# Patient Record
Sex: Male | Born: 1950 | Race: White | Hispanic: No | Marital: Married | Smoking: Current every day smoker
Health system: Southern US, Community
[De-identification: ages and names within clinical notes are randomized; demographics above are authoritative.]

## PROBLEM LIST (undated history)

## (undated) DIAGNOSIS — M1990 Unspecified osteoarthritis, unspecified site: Secondary | ICD-10-CM

## (undated) DIAGNOSIS — E785 Hyperlipidemia, unspecified: Secondary | ICD-10-CM

## (undated) DIAGNOSIS — M67919 Unspecified disorder of synovium and tendon, unspecified shoulder: Secondary | ICD-10-CM

## (undated) DIAGNOSIS — T7840XA Allergy, unspecified, initial encounter: Secondary | ICD-10-CM

## (undated) HISTORY — DX: Hyperlipidemia, unspecified: E78.5

## (undated) HISTORY — DX: Allergy, unspecified, initial encounter: T78.40XA

## (undated) HISTORY — PX: VASECTOMY: SHX75

## (undated) HISTORY — PX: COLON SURGERY: SHX602

## (undated) HISTORY — DX: Unspecified disorder of synovium and tendon, unspecified shoulder: M67.919

## (undated) HISTORY — DX: Unspecified osteoarthritis, unspecified site: M19.90

## (undated) HISTORY — PX: SMALL INTESTINE SURGERY: SHX150

## (undated) HISTORY — PX: OTHER SURGICAL HISTORY: SHX169

## (undated) HISTORY — PX: HERNIA REPAIR: SHX51

---

## 1998-06-10 DIAGNOSIS — M67919 Unspecified disorder of synovium and tendon, unspecified shoulder: Secondary | ICD-10-CM

## 1998-06-10 HISTORY — DX: Unspecified disorder of synovium and tendon, unspecified shoulder: M67.919

## 2000-09-15 ENCOUNTER — Encounter: Admission: RE | Admit: 2000-09-15 | Discharge: 2000-09-15 | Payer: Self-pay | Admitting: Family Medicine

## 2000-09-15 ENCOUNTER — Encounter: Payer: Self-pay | Admitting: Family Medicine

## 2006-10-01 ENCOUNTER — Encounter: Admission: RE | Admit: 2006-10-01 | Discharge: 2006-10-01 | Payer: Self-pay | Admitting: Family Medicine

## 2007-03-29 ENCOUNTER — Inpatient Hospital Stay (HOSPITAL_COMMUNITY): Admission: EM | Admit: 2007-03-29 | Discharge: 2007-04-02 | Payer: Self-pay | Admitting: *Deleted

## 2007-07-17 ENCOUNTER — Encounter (INDEPENDENT_AMBULATORY_CARE_PROVIDER_SITE_OTHER): Payer: Self-pay | Admitting: General Surgery

## 2007-07-17 ENCOUNTER — Inpatient Hospital Stay (HOSPITAL_COMMUNITY): Admission: RE | Admit: 2007-07-17 | Discharge: 2007-07-23 | Payer: Self-pay | Admitting: General Surgery

## 2007-07-24 ENCOUNTER — Inpatient Hospital Stay (HOSPITAL_COMMUNITY): Admission: EM | Admit: 2007-07-24 | Discharge: 2007-08-02 | Payer: Self-pay | Admitting: Emergency Medicine

## 2007-07-24 ENCOUNTER — Encounter: Payer: Self-pay | Admitting: General Surgery

## 2007-07-25 ENCOUNTER — Encounter (INDEPENDENT_AMBULATORY_CARE_PROVIDER_SITE_OTHER): Payer: Self-pay | Admitting: Surgery

## 2007-08-24 ENCOUNTER — Inpatient Hospital Stay (HOSPITAL_COMMUNITY): Admission: AD | Admit: 2007-08-24 | Discharge: 2007-08-25 | Payer: Self-pay

## 2008-10-23 IMAGING — CT CT ABDOMEN W/ CM
2 of 5 series · 16 of 46 positions shown, 18 images · IV contrast (omnipaque)
Comparison: CT abdomen and pelvis 04/02/07

CLINICAL DATA: 56-year-old male with abdominal pain and prior partial colectomy for diverticulitis.
ABDOMEN CT WITH CONTRAST:
TECHNIQUE: Multidetector CT imaging of the abdomen was performed following the standard protocol during bolus administration of intravenous contrast.
Contrast:  100 cc Omnipaque 300
TECHNIQUE: Multidetector CT imaging of the pelvis was performed following the standard protocol during bolus administration of intravenous contrast.

[Series 2: abd/pelv with 5.0 b31f st · axial · 0.71mm/px · z∈[-552,-72]mm · 13 of 109 slices shown, 15 images]
[im 7/109  soft-tissue]
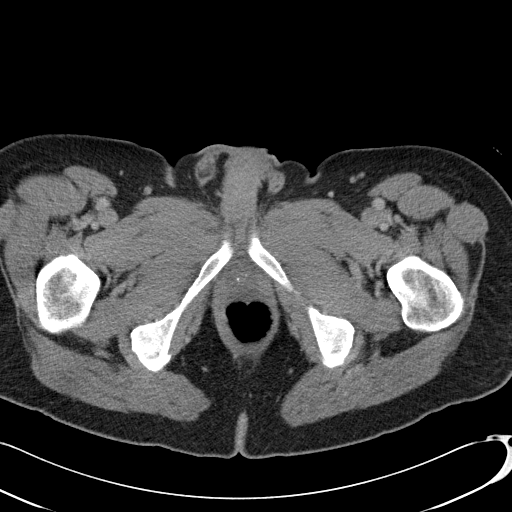
[im 7/109  bone]
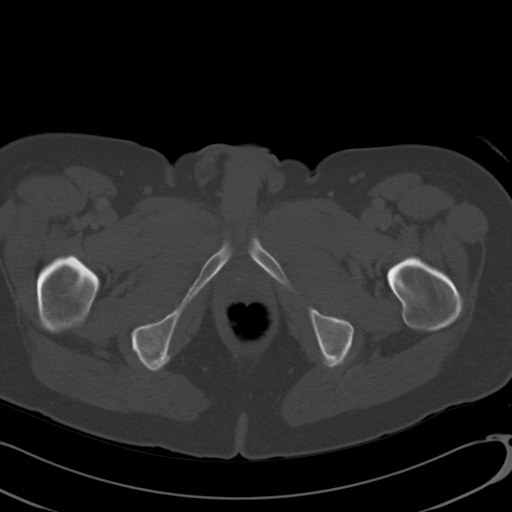
[im 13/109  soft-tissue]
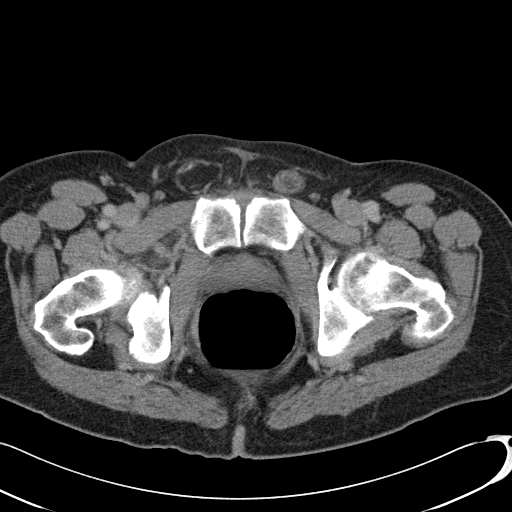
[im 25/109  soft-tissue]
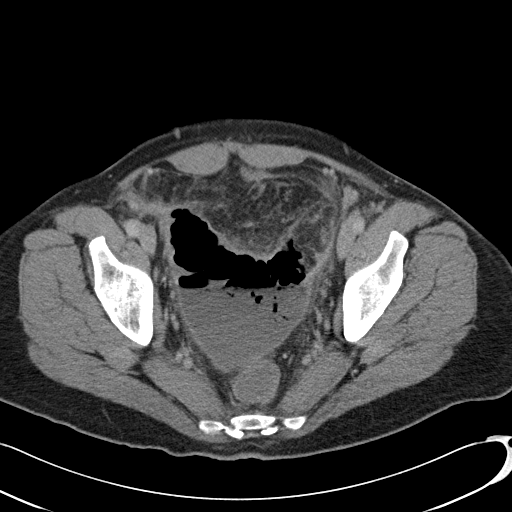
[im 31/109  soft-tissue]
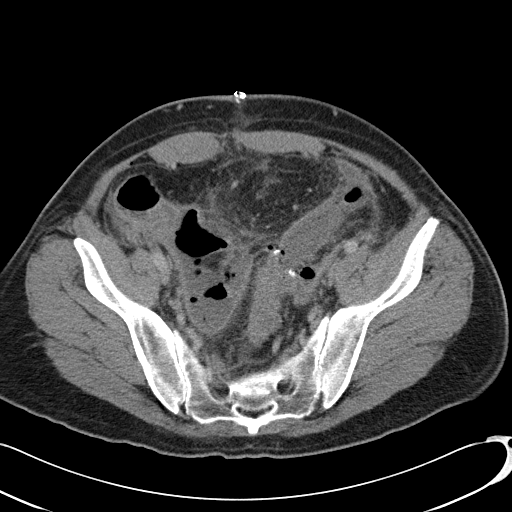
[im 37/109  soft-tissue]
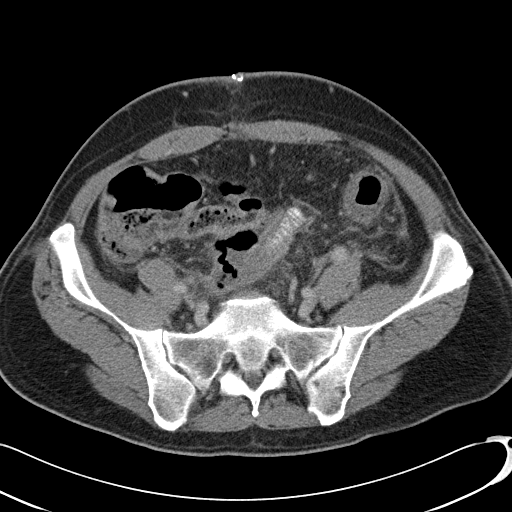
[im 49/109  soft-tissue]
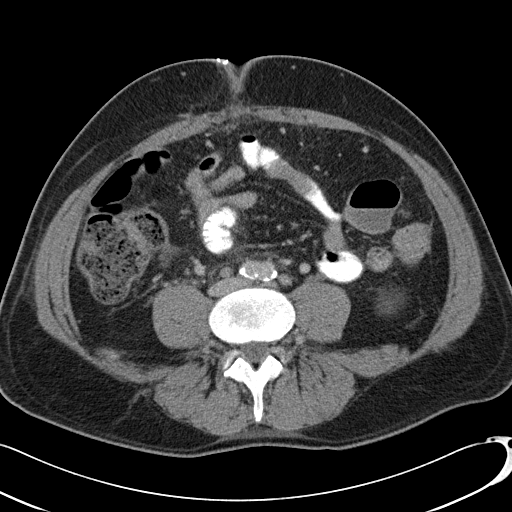
[im 55/109  soft-tissue]
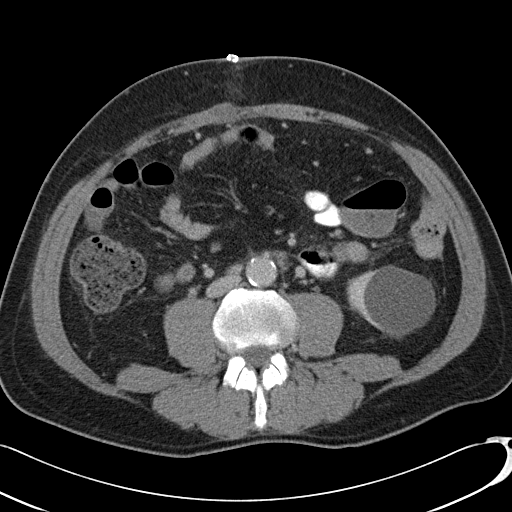
[im 61/109  soft-tissue]
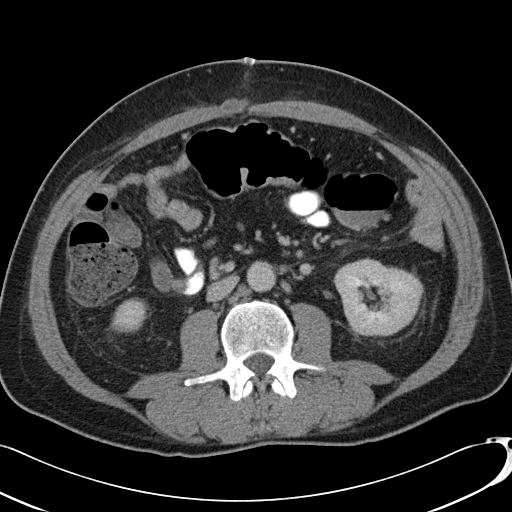
[im 73/109  soft-tissue]
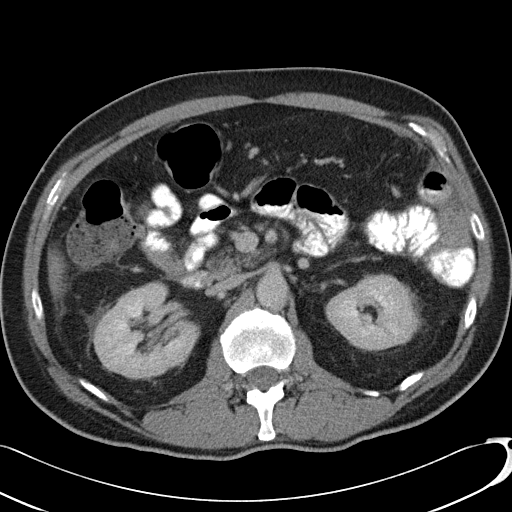
[im 73/109  bone]
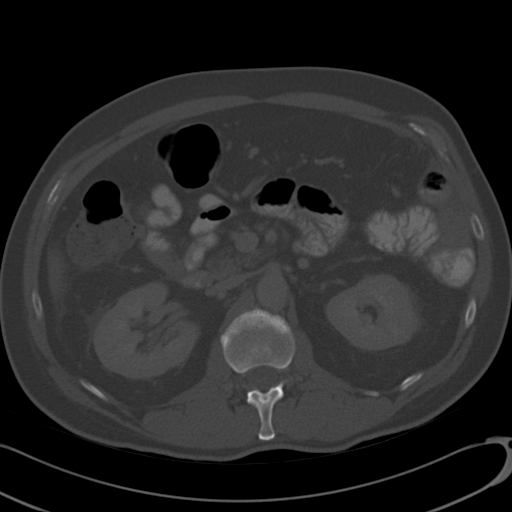
[im 79/109  soft-tissue]
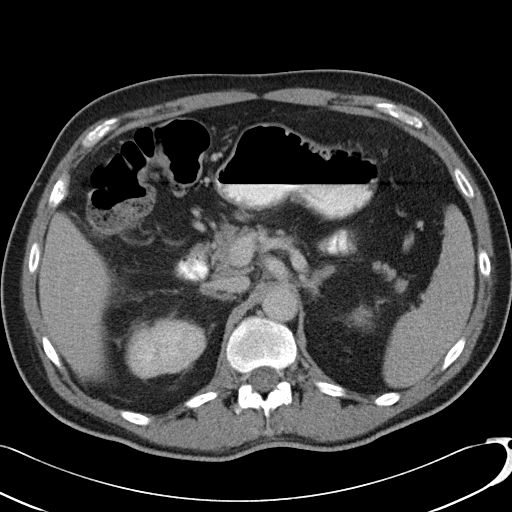
[im 85/109  soft-tissue]
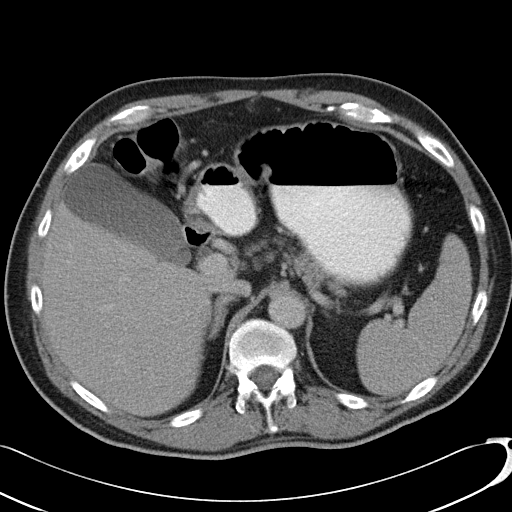
[im 97/109  soft-tissue]
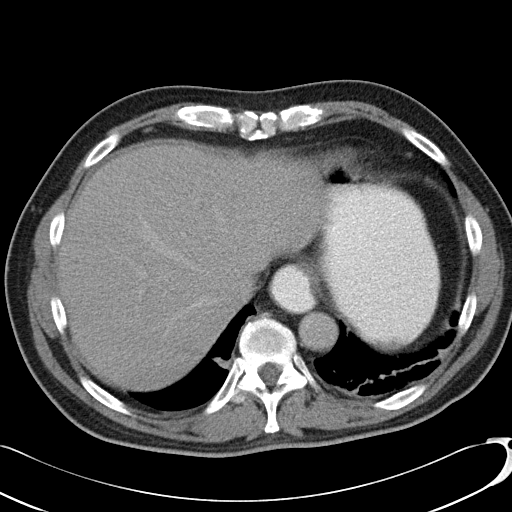
[im 103/109  soft-tissue]
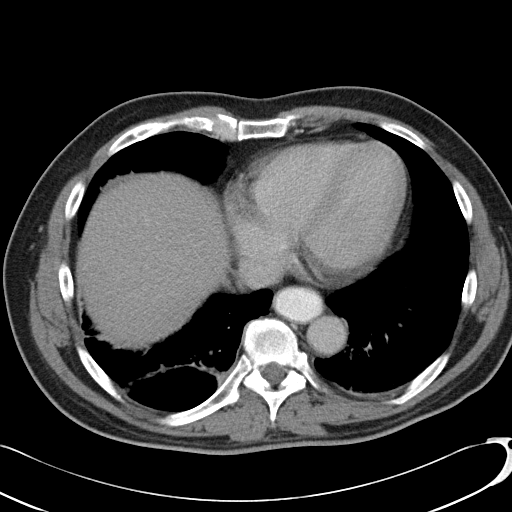

[Series 5: abd/pelv with 2.0 spo stcor · coronal · 1.06mm/px · 3 of 139 slices shown]
[im 47/139  soft-tissue]
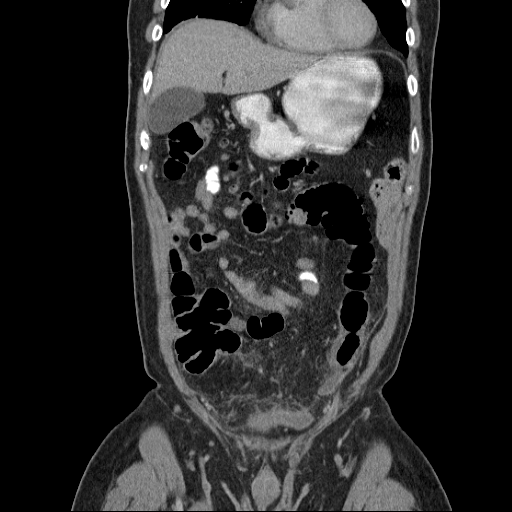
[im 62/139  soft-tissue]
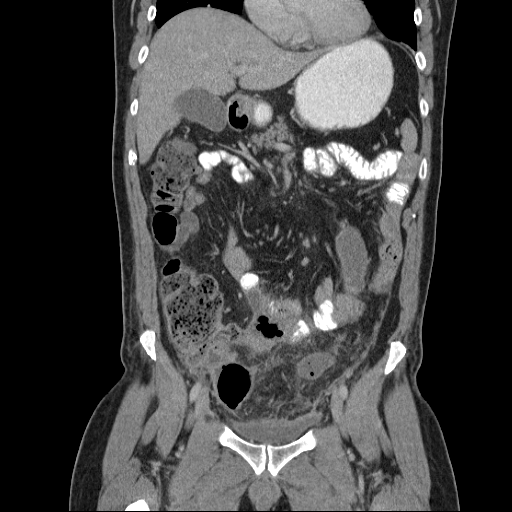
[im 77/139  soft-tissue]
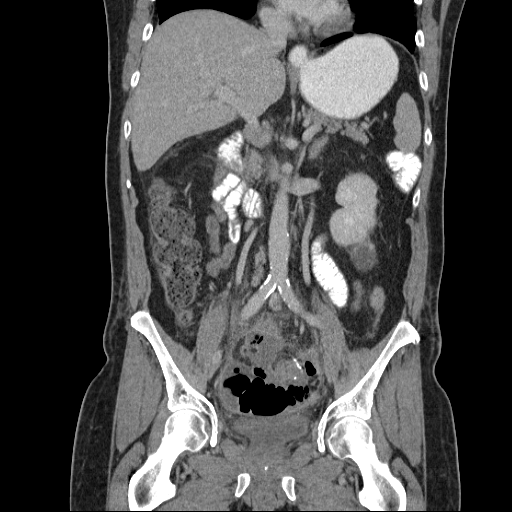

[16 of 46 positions shown; findings below may reference images not displayed]

FINDINGS: There is pleural parenchymal scarring at the bilateral lung bases increased from prior.  
There are multiple low-density lesions within the liver which are unchanged in size or number from prior, the largest of which have simple fluid attenuation and likely represent simple benign hepatic cysts.  The gallbladder, pancreas, spleen, adrenal glands, and kidneys appear normal.  (There is a low-density exophytic simple cyst from the inferior pole of the left kidney).  There is mild stranding within the mesentery of the abdomen.  There is mild stranding along ventral incisional wound.  Normal terminal ileum.  The appendix is not clearly visualized.
IMPRESSION: 1.  No acute process within the abdomen.
2.  Stable simple hepatic cysts.  
3.  Increased scarring at the lung bases.
PELVIS CT WITH CONTRAST:
FINDINGS: There is a large collection within the pelvis posterior and superior to the bladder and anterior to the rectum measuring 12 cm x 9 cm on image 85.  This collection appears to have fecal material in a dependent location and gas in a non-dependent location.  This abscess/fluid collection is adjacent to the surgical anastomosis in the sigmoid colon seen on image 79 and may represent a break down of the anastomotic line or perforation.  The administered oral contrast only advances to the distal ileum.  No evidence of extra-luminal enteric contrast and no evidence of bowel obstruction.  
Review of bone windows demonstrates no aggressive osseous lesion.
IMPRESSION: 1. Large collection of stool and gas in the pelvis adjacent to sigmoid anastamosis most consistant with perforation/leak.
Findings discussed with Dr Tram on 07/24/07.

## 2008-10-23 IMAGING — CR DG ABDOMEN ACUTE W/ 1V CHEST
4 series · 4 of 4 positions shown · non-contrast
Comparison: Abdominal CT 04/02/07.

CLINICAL DATA: Abdominal pain after surgery.
 ACUTE ABDOMINAL SERIES WITH CHEST:

[w chest pa]
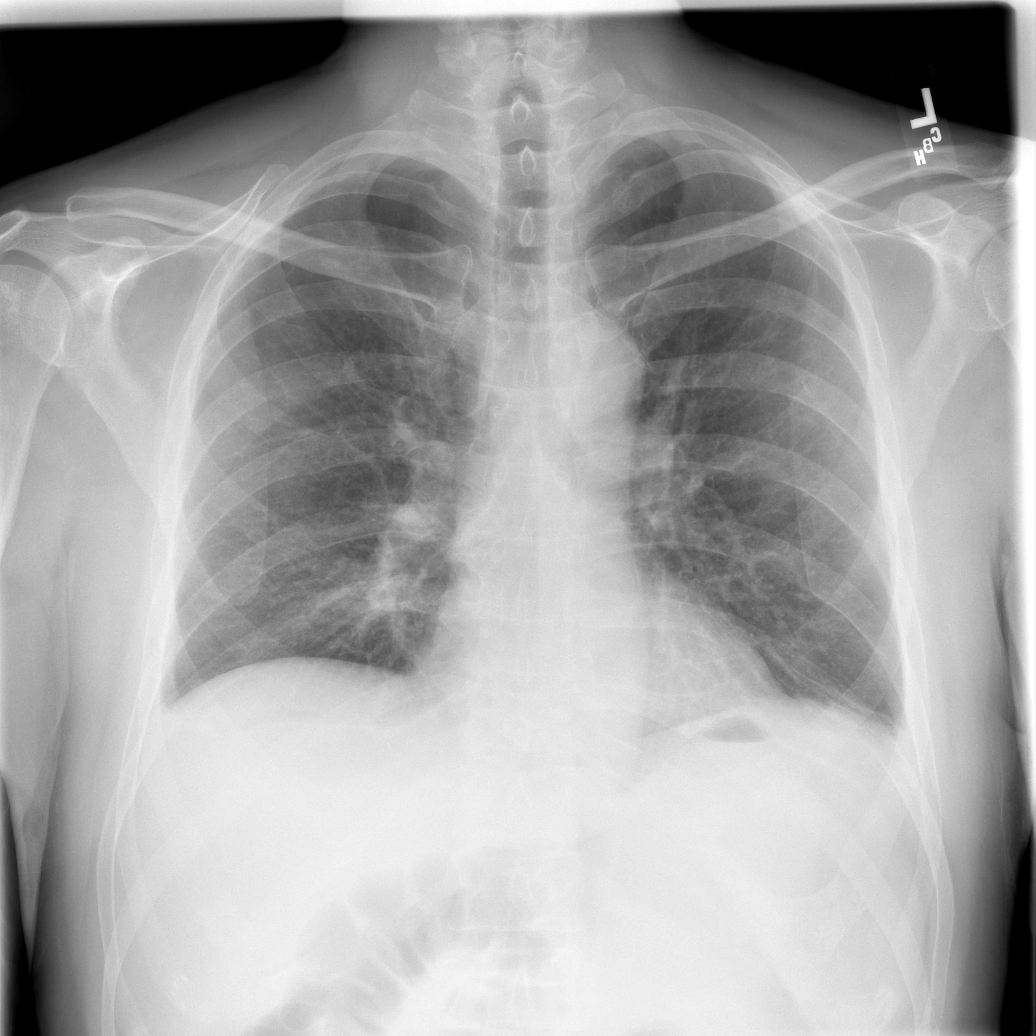

[w abdomen upright]
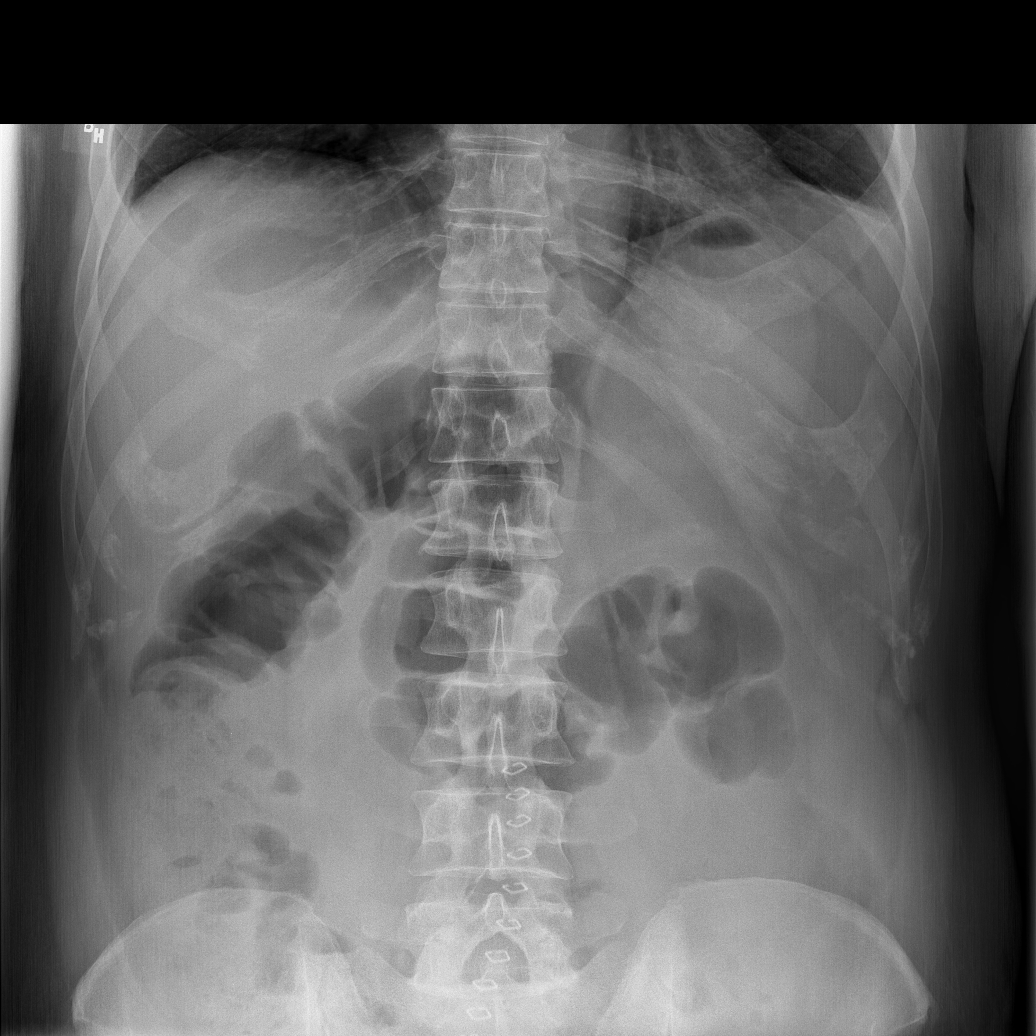

[t abdomen supine (1 of 2)]
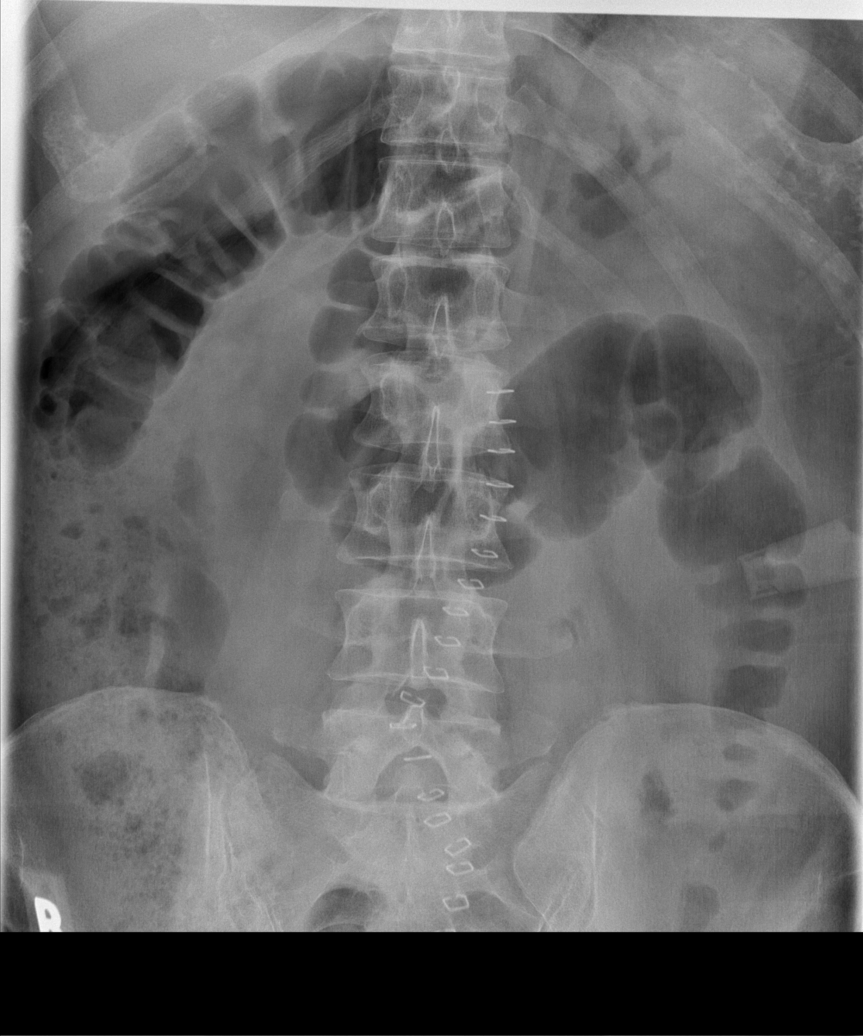

[t abdomen supine (2 of 2)]
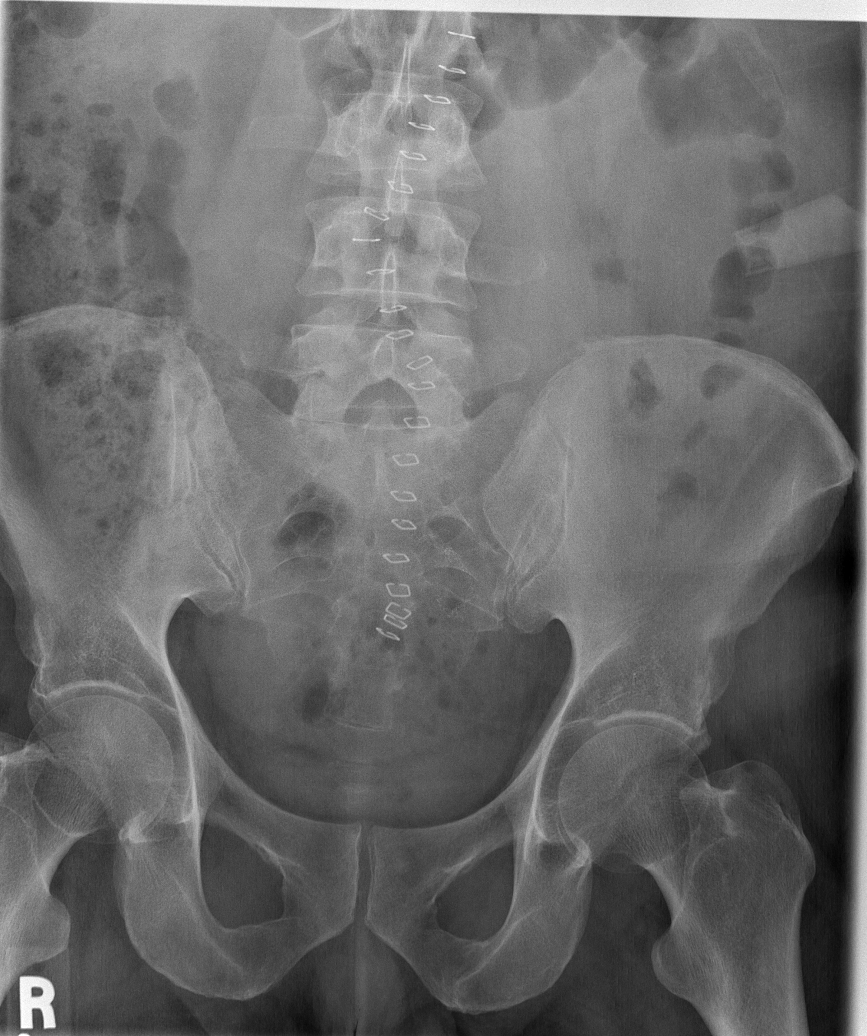

[4 of 4 positions shown; findings below may reference images not displayed]

FINDINGS: Frontal chest radiograph demonstrates mild bibasilar atelectasis.   The heart size and mediastinal contours are normal.   There is probably a small amount of pleural fluid bilaterally.   
 Supine and erect views of the abdomen demonstrate a normal nonobstructive bowel gas pattern.   Midline skin staples are noted.   There is no evidence of free intraperitoneal air.
IMPRESSION: 1.   No acute abdominal findings status post abdominal surgery.
 2.   Mild basilar atelectasis and small bilateral pleural effusions.

## 2010-07-01 ENCOUNTER — Encounter: Payer: Self-pay | Admitting: Family Medicine

## 2010-10-23 NOTE — Discharge Summary (Signed)
NAME:  Anthony [name], Anthony [name] NO.:  [id number]   MEDICAL RECORD NO.:  [id number]          PATIENT TYPE:  INP   LOCATION:  5148                         FACILITY:  [facility]   PHYSICIAN:  Gabrielle Dare. Janee Morn, M.D.DATE OF BIRTH:  1951-03-02   DATE OF ADMISSION:  07/17/2007  DATE OF DISCHARGE:  07/23/2007                               DISCHARGE SUMMARY   DISCHARGE DIAGNOSIS:  Status post sigmoid and partial left colectomy for  diverticular disease.   HISTORY OF PRESENT ILLNESS:  Mr. Schmelzle is a 60 year old gentleman who  presented for elective sigmoid colectomy for chronic diverticular  disease.   HOSPITAL COURSE:  The patient underwent a sigmoid and partial left  colectomy on July 17, 2007 on the day of admission.  Mobilization of  splenic flexure was also done, so it was a very difficult case due to a  very short colonic mesentery.  EEA anastomosis was done.  Postoperatively, he had the usual ileus.  NG tube was continued.  He was  given inhalers to assist with respiratory toilet.  His white count  trended back towards normal.  He tolerated some oral pain medication and  passed bowel movements x2 on February 10, so he was started on clear  liquids the following day.  He had his diet advanced further.  Pain was  controlled on oral pain medication.  His abdominal exam was soft.  He  had absolutely no fevers and no tachycardia or other problems.  Subsequently, on postoperative day 6, after tolerating regular diet,  continuing to move his bowels, and with a stable abdominal exam and  vitals he was discharged home.   DISCHARGE DIET:  Low residual.   DISCHARGE MEDICATIONS:  Percocet 5/325 one to two every 6 hours as  needed for pain.  He is also to take docusate over-the-counter stool  softener 100 mg daily.  He will also continue his home medications of  Lipitor 10 mg daily, multivitamin, and other supplements daily, 81 mg of  aspirin daily, glucosamine supplement daily and  Prilosec one daily.   FOLLOW-UP:  Is in approximately 5 days with myself or sooner, if he has  any trouble.      Gabrielle Dare Janee Morn, M.D.  Electronically Signed     BET/MEDQ  D:  08/13/2007  T:  08/14/2007  Job:  [id number]

## 2010-10-23 NOTE — Op Note (Signed)
NAME:  Anthony [name], SCIULLI NO.:  [id number]   MEDICAL RECORD NO.:  [id number]          PATIENT TYPE:  INP   LOCATION:  5125                         FACILITY:  [facility]   PHYSICIAN:  Ardeth Sportsman, MD     DATE OF BIRTH:  1950-12-31   DATE OF PROCEDURE:  07/25/2007  DATE OF DISCHARGE:  07/24/2007                               OPERATIVE REPORT   PRIMARY CARE PHYSICIAN:  Donia Guiles, MD   PRIOR SURGEON:  Gabrielle Dare. Janee Morn, MD   OPERATING SURGEON:  Ardeth Sportsman, MD   OPERATING ASSISTANT:  Angelia Mould. Derrell Lolling, MD.   [facility]   PREOPERATIVE DIAGNOSIS:  Postop day 7 sigmoid colectomy for  diverticulitis with pelvic abscess and question of anastomotic leak.   POSTOPERATIVE DIAGNOSES:  1. Status post sigmoid colectomy with anastomosis for recurrent      diverticulitis.  2. Pelvic hematoma/abscess.  3. Anastomotic leak.   PROCEDURE PERFORMED:  1. Exploratory laparotomy.  2. Drainage of pelvic abscess.  3. Partial colectomy of ischemic/necrotic descending colon.  4. Proximal descending colon colostomy left upper quadrant.   ANESTHESIA:  General anesthesia.   SPECIMENS:  Descending colon opened and his distal drained, 19 [place]  Blake drain rests in the pelvis and towards the left paracolic gutter.   ESTIMATED BLOOD LOSS:  Three hundred mL of fresh blood, about 500 mL of  old blood.   INDICATIONS FOR PROCEDURE:  Anthony [name] is a 60 year old gentleman who is  postop day 7 for an elective sigmoid colectomy for recurrent bouts of  diverticulitis.  He had a 25 EEA stapled anastomosis.  Postoperatively  he recovered a little bit slowly but well enough to go home.  He started  having increasing malaise, bloating and decreased appetite and started  complaining of some back pain and difficulty with urination.  Evaluation  showed evidence of a very large pelvic abscess with gas near the  anastomosis concerning for an anastomotic  leak.  Differential diagnosis  was discussed, options discussed and recommendations made for  exploratory laparotomy with drainage of pelvic abscess and possible  takedown of anastomosis if evidence of perforation or necrosis.   Risks such as stroke or heart attack, deep venous thrombosis, pulmonary  embolism and death were discussed.  Risks such as bleeding, need for  transfusion, wound infection, abscess, injury to other organs, prolonged  pain, incisional hernia, need for re-operation and other risks were  discussed.  Questions answered.  He and his wife agreed to proceed.   OPERATIVE FINDINGS:  He had a large pocket of gas and old blood down in  his pelvis.  There was no frank purulence.  He had obvious anterior  perforation involving about 1/6 of the circumference of the colon.  The  colon 5 cm proximal to the anastomosis had evidence of necrosis and  ischemia.  There was no twisting or torsion on the anastomosis.   DESCRIPTION OF PROCEDURE:  Informed consent was confirmed.  The patient  had already received IV Zosyn prior to induction.  Sequential  compression devices were active during the entire case.  He was  positioned supine with arms out.  He underwent general anesthesia  without any difficulty.  He had a Foley catheter sterilely placed.  His  abdomen was prepped and draped in sterile fashion.  Clips were removed.   Incision was opened up bluntly in the subcutaneous tissues.  Looped PDS  stitches were trimmed and moved out.  I got into the peritoneal cavity  easily.  The omentum was still in place.  Using careful dissection I was  able to enter into a very large pocket down in the pelvis that was being  walled off by the omentum, small bowel, and peritoneum with foul smell  to it.  This looked like old blood.  Large swath was aspirated and sent  for culture.  Hematoma was aspirated and suctioned.  The greater omentum  was gradually freed off at its attachment to the  peritoneum and small  bowel loops and descending colon and peeled back.  Small bowel loops  could be seen climbing posterior to the anastomosis and these were  brought back over towards the right.  There were some dense adhesions of  some small bowel loops to the retroperitoneum and down towards the  anastomosis and these were carefully freed off as well.  With completion  of this it became apparent that there was anterior perforation involving  about 1/6 of the circumference.  There was significant ischemia headed  more towards necrosis at the proximal end of the anastomosis and the  descending colon.  The rectal stump appeared viable although inflamed.  A fair amount of peel and contamination down in the pelvis.  With this I  decided to take down the anastomosis.  The rectal stump was too inflamed  to place a contour staple on it so therefore we used cautery to help  free the proximal end of the anastomosis off.  The rectal stump was  oversewn using running 2-0 PDS as well as 2-0 Prolene stitches in both  corners with long tails.   The greater omentum was freed off the proximal descending colon and up  to the mid transverse colon to help better mobilize the colon.  Some  adhesions of the left upper quadrant were freed off although it did  appear that the patient had pretty good splenic flexure mobilization.  Care was made to avoid any injury to the transverse and descending colon  mesentery.  The nonviable colon was transected using a GIA stapler and  specimen was sent off, opened and distal.  Copious irrigation was done  of about 12 liters in all quadrants, over half of it down in the pelvis  until there was good clear return.  Obvious fibrinous peel with necrotic  peritoneum was debrided off the rectal stump down in the bladder wall  and off the peritoneal covering of the dome of the bladder that came off  easily.  Hemostasis was ensured.  Because of his obesity and distention  I  elected to place the colostomy in the left upper quadrant and the site  was chosen a few fingerbreadths superior and about 2 fingerbreadths  superior and 3 fingerbreadths lateral to the umbilicus in the left upper  quadrant staying away from the subcostal ridge.  A circular defect was  created about 3 x 3 cm in size.  Swath of subcutaneous tissue was freed  out.  The anterior rectus fascia was opened in a cruciate fashion.  The  rectus muscle was split and the posterior fascia  was opened up.  Dilation was done to allow 2 fingerbreadths easily.  The descending  colon stump was brought up through the wound easily and I could bring up  about 2 inches out of the wound.   Fascia was closed using running #1 looped PDS for both ends as well as  using #1 PDS interrupted internal retention stitches x4 with good  result.  The wound was irrigated copiously with over a liter of saline  with a nice clean return.  The wound was gently approximated using some  intermittent staples using large gaps to avoid overclosure.  The staples  were more closely together around the umbilicus for cosmetic reasons.  The wound was packed using 4-0 4 x 4 gauze.  Sterile dressings covered.  The colostomy was matured using 3-0 Vicryl interrupted stitches first  placing in the 4 corners to allow the ostomy to evert and Brooke rather  well, and Brooke at least about a centimeter and a half off the skin.  Note that the mesentery seemed to align best at high noon if the patient  was standing up, in other words at the superior edge.  Colostomy  appliance was placed.  The patient was taken to the recovery room in  stable condition.   I explained the operative findings to the patient's wife.  Postop  instructions were discussed.  She expressed understanding and  appreciation.      Ardeth Sportsman, MD  Electronically Signed     SCG/MEDQ  D:  07/25/2007  T:  07/27/2007  Job:  [id number]   cc:   Donia Guiles, M.D.  Gabrielle Dare Janee Morn, M.D.

## 2010-10-23 NOTE — Consult Note (Signed)
NAME:  Anthony [name], Anthony [name] NO.:  [id number]   MEDICAL RECORD NO.:  [id number]          PATIENT TYPE:  INP   LOCATION:  5734                         FACILITY:  [facility]   PHYSICIAN:  Gabrielle Dare. Janee Morn, M.D.DATE OF BIRTH:  01-Apr-1951   DATE OF CONSULTATION:  03/30/2007  DATE OF DISCHARGE:                                 CONSULTATION   REASON FOR CONSULTATION:  Diverticulitis.   HISTORY OF PRESENT ILLNESS:  I was asked to evaluate this pleasant 60-  year-old gentleman in regards to descending colon and sigmoid  diverticulitis by Dr. Adela Glimpse.  He has a history of diverticulitis in  the past, though previous episodes have been mild.  He has never been  hospitalized for it before.  He has undergone colonoscopy at [facility] in  the past.  This time, the pain developed in his left lower quadrant and  was more severe than previous attacks.  He was admitted and placed on  intravenous antibiotics.  A CT scan of the abdomen and pelvis was done,  this demonstrates a descending colon and sigmoid diverticulitis with a  2.5 cm mural abscess.  There is no perforation or free air.  The patient  currently is feeling much better.  Took some pain medication and the  pain is much less.   PAST MEDICAL HISTORY:  1. Diverticulitis.  2. Hypercholesterolemia.   PAST SURGICAL HISTORY:  None.   MEDICATIONS:  At home:  1. Cipro.  2. Flagyl.   ALLERGIES:  NO KNOWN DRUG ALLERGIES.   SOCIAL HISTORY:  He smokes cigarettes.  He does not drink alcohol.  He  works at [employer].   REVIEW OF SYSTEMS:  Significant for GI consistent with left lower  quadrant and abdominal pain.  He has no significant nausea.  Remainder  of the review of systems is unremarkable.   PHYSICAL EXAMINATION:  VITAL SIGNS:  Temperature is 99.0, blood pressure  95/54, heart rate 82, respirations 20, saturations 96%.  GENERAL:  He is awake and alert.  HEENT:  Pupils are equal.  Sclerae are clear.  NECK:  Supple  with no tenderness.  LUNGS:  Clear to auscultation.  No wheezing is heard.  HEART:  Regular.  No murmurs are heard.  Pulses palpable in the left  chest.  ABDOMEN:  Soft.  He has mild left lower quadrant tenderness.  He has no  masses palpated.  No hepatosplenomegaly is noted.  SKIN:  Warm and dry with no rashes.  EXTREMITIES:  No significant edema.  LYMPH:  Reveals no supraclavicular, cervical, or periumbilical  lymphadenopathy.   LABORATORY DATA:  Data reviewed includes white blood cell count 11.3,  hemoglobin 15.1.  Basic metabolic panel was within normal limits.  Liver  function tests were normal.  Lipase is 14.   IMPRESSION:  Sigmoid and descending colon diverticulitis with a small  mural abscess.   RECOMMENDATIONS:  Recommend continuing intravenous antibiotics and bowel  rest.  We will follow him along with you.  I feel he will likely respond  to medical treatment; however, if he worsens he may need emergent  colectomy  and colostomy.  This was discussed with him in detail.  Questions were answered.      Gabrielle Dare Janee Morn, M.D.  Electronically Signed     BET/MEDQ  D:  03/30/2007  T:  03/30/2007  Job:  [id number]   cc:   [facility]

## 2010-10-23 NOTE — Discharge Summary (Signed)
NAME:  Anthony [name], Anthony [name] NO.:  [id number]   MEDICAL RECORD NO.:  [id number]          PATIENT TYPE:  INP   LOCATION:  5734                         FACILITY:  [facility]   PHYSICIAN:  Michelene Gardener, MD    DATE OF BIRTH:  06/17/1950   DATE OF ADMISSION:  03/29/2007  DATE OF DISCHARGE:  04/02/2007                               DISCHARGE SUMMARY   PRIMARY CARE PHYSICIAN:  Dr. Donia Guiles.   DISCHARGE DIAGNOSES:  1. Diverticulitis with mural abscess.  2. Hyperlipidemia.   DISCHARGE MEDICATIONS:  1. Augmentin 875 mg twice a day for seven days.  2. Vicodin 1-2 tabs p.o. q.4 h. as needed.  3. Over-the-counter Tylenol every eight hours as needed.  The patient      was advised not to take this with Vicodin at the same time.  4. Over-the-counter MiraLax as needed.  5. Zocor 20 mg p.o. once a day.   CONSULTATIONS:  Surgical consult by Dr. Gabrielle Dare. Janee Morn, done March 30, 2007.   PROCEDURES:  None.   FOLLOW-UP APPOINTMENTS:  1. Dr. Donia Guiles in 1-2 weeks.  2. Dr. Luisa Hart in 2-3 weeks.   RADIOLOGY STUDIES:  1. CT scan of the abdomen done on October 19 showed a small hiatal      hernia with hepatic and renal cysts and descending colon      diverticulosis.  2. CT scan of the pelvis done on October 19 showed acute sigmoid colon      diverticulitis with intramural abscess around 2.5 cm.  3. Repeat CT scan of the abdomen on October 23 showed no acute      abnormalities.  4. Repeat CT scan of the pelvis on October 23 showed improving sigmoid      diverticulitis with resolved abscess.   COURSE OF HOSPITALIZATION:  This is a 60 year old male who presented to  the hospital on October 19 complaining of abdominal pain.  CT scan of  his pelvis showed acute diverticulitis with intramural abscess.  The  patient was admitted to the hospital for further evaluation.  The  patient was started on IV antibiotics where he received IV Zosyn.  He  was kept initially n.p.o., and  then he was started on a clear liquid  diet.  The patient tolerated clear liquid diet very well and then he was  advanced to full diet without problems.  The patient was seen and  evaluated by surgery who recommended him for medical treatment for now,  and then they repeated his CAT scan on the 23rd of October.  His CAT  scan showed improving diverticulitis with abscess that resolved.  He was  recommended to continue on Augmentin to be taken for seven days and then  to take Vicodin for pain as needed.  The patient will follow with his  primary physician in 2-3 weeks to make sure his condition is improving.  Otherwise,  other medical conditions remained stable, and the patient was continued  on the same other medications taken at home.   __________  time is 40 minutes.      Nadear A  Arthor Captain, MD  Electronically Signed     NAE/MEDQ  D:  04/02/2007  T:  04/03/2007  Job:  [id number]   cc:   Donia Guiles, M.D.

## 2010-10-23 NOTE — H&P (Signed)
NAME:  Anthony [name], Anthony [name] NO.:  [id number]   MEDICAL RECORD NO.:  [id number]          PATIENT TYPE:  INP   LOCATION:  5152                         FACILITY:  [facility]   PHYSICIAN:  Anselm Pancoast. Weatherly, M.D.DATE OF BIRTH:  09/20/1950   DATE OF ADMISSION:  08/24/2007  DATE OF DISCHARGE:                              HISTORY & PHYSICAL   CHIEF COMPLAINT:  Feeling bad, drainage and tenderness in wound.   HISTORY OF PRESENT ILLNESS:  The patient is a 60 year old male who  underwent a sigmoid colectomy by Gabrielle Dare. Janee Morn, M.D. on July 17, 2007, and was discharged after approximately one week and then was  readmitted by Ardeth Sportsman, M.D. approximately a week later with an  anastomotic leak and a pelvic abscess.  Dr. Michaell Cowing reoperated on the  patient and converted him to an end-colostomy and Luz Brazen and drainage  and the patient has been seen here in the office on a couple of  occasions since his discharge and has been seen daily by the visiting  nurses.  He called today and said that he was having more pain.  He said  that he had asked for antibiotics and on checking his abdomen, he  obviously has purulent drainage that is coming up from multiple little  wick tracts where the nurses have been doing wound checks.  If colostomy  is end colostomy that looks like it is working nicely.  There is a  little bit of stool in it.  On rectal examination, he is uncomfortable,  but I cannot feel anything that obviously feels like an abscess.  He  says he has been nauseated today and has been unable to keep hardly  anything down and with these symptoms and obviously wound infection that  is hopefully all superficial, I think it would be best to readmit him,  put him on IV antibiotics, and get a CAT scan of the abdomen and pelvis  and talk to Velora Heckler, M.D. who is actually oncall tonight.  He  asked me to go ahead and dictate the history and physical as I am doing  at this  time.   CHRONIC MEDICATIONS:  1. Lipitor 10 mg a day.  2. Baby aspirin.  3. Multivitamin.  4. Percocet for pain.   He has not been on antibiotics.   He says that originally he was admitted by Dr. Janee Morn with  diverticulitis and abscess some time last year and then started having  repeat symptoms and that is when his surgery was scheduled for a sigmoid  colectomy.   PAST SURGICAL HISTORY:  Rotator cuff repair on the right.   SOCIAL HISTORY:  He smokes about 1/4 pack of cigarettes a day.   PHYSICAL EXAMINATION:  VITAL SIGNS:  He is 6 feet 1 inch, weight 189  pounds, blood pressure 105/79, pulse 100, and temperature 97.8.  GENERAL:  The patient appears fatigued and is quite thin.  LUNGS:  Normal.  HEART:  Sinus tachycardia.  He is not dyspneic.  ABDOMEN:  Kind of a flat abdomen and a big dressing over the midline  incision with the purulent little pockets that will drain pus when you  take the wicks out.  I opened two in the lower abdomen and with pressure  on the incision you could see it kind of mounds up in the other little  areas and the little bridges between the subcutaneous tissue are deep  enough I think it would be very painful to try to open up all of these  here in the office now since he is about four weeks following surgery.   I think that he needs to be readmitted, started on IV antibiotics, a CAT  scan of abdomen and pelvis performed, and if there is no deep intra-  abdominal abscess, then it is possible that these areas could be opened  at bedside with sedation, but I wonder if it would not be better to just  shorten general anesthesia and just open the wound.  If he has an  obvious intra-abdominal abscess, then definitely that will need to be  taken care of simultaneously.  The patient is sent over with his orders  and we will get the CT tonight and they will call the results to Dr.  Gerrit Friends.   IMPRESSION:  Wound infection, four weeks status post a surgery  for  anastomotic leak following a sigmoid colectomy approximately a week  earlier.           ______________________________  Anselm Pancoast. Zachery Dakins, M.D.     WJW/MEDQ  D:  08/24/2007  T:  08/25/2007  Job:  [id number]

## 2010-10-23 NOTE — Op Note (Signed)
NAME:  COADY, TRAIN NO.:  [id number]   MEDICAL RECORD NO.:  [id number]          PATIENT TYPE:  INP   LOCATION:  5148                         FACILITY:  [facility]   PHYSICIAN:  Gabrielle Dare. Janee Morn, M.D.DATE OF BIRTH:  [birth year]   DATE OF PROCEDURE:  07/17/2007  DATE OF DISCHARGE:                               OPERATIVE REPORT   PREOPERATIVE DIAGNOSIS:  Sigmoid diverticulitis.   POSTOPERATIVE DIAGNOSIS:  Sigmoid diverticulitis.   PROCEDURES:  1. Sigmoid and partial left colectomy.  2. Mobilization of splenic flexure.   SURGEON:  Gabrielle Dare. Janee Morn, M.D.   ASSISTANT:  Cherylynn Ridges, M.D.   ANESTHESIA:  General.   HISTORY OF PRESENT ILLNESS:  Mr. Dykman is a 60 year old gentleman who  was initially admitted to the hospital on October 2008 for sigmoid and  descending colon diverticulitis.  He had a small mural abscess that  time; he completed course of oral antibiotics.  He continued to have  some intermittent left lower quadrant pain throughout the end of 2008,  with some urgency to defecate.  He has been taking MiraLax and a stool  softener daily.  He presents today for elective sigmoid colectomy.  He  did undergo a bowel prep, which he tolerated well.  He has also held his  aspirin for 7 days.   PROCEDURE IN DETAIL:  Informed consent was obtained after identifying  the patient in the preoperative holding area.  He received intravenous  antibiotics.  He was brought to the operating room and placed in the  supine position.  General anesthesia was administered.  His abdomen was  prepped and draped in a sterile fashion.  A lower midline incision was  made, extending to just above the umbilicus.  Subcutaneous tissues were  dissected down, revealing the anterior fascia.  This was divided along  the linea alba, and the peritoneal cavity was entered under direct  vision without difficulty.  The fascia was opened to the length of the  incision.  The abdomen was  explored.  No liver lesions were noted.  He  had a very large, bulky omentum.  The nasogastric tube was adjusted into  the correct position in the stomach.  Further evaluation of the left and  sigmoid colon revealed an inflammatory mass down in the pelvis, stuck to  the pelvic sidewall.  From this chronic diverticular inflammation, the  left colon was then mobilized along the lateral peritoneal attachments,  coming along the line of Toldt and extending down to the sigmoid.  This  was further mobilized.  This was where the major diseased portion was;  it was quite stuck to the left pelvic sidewall.  We stayed very  laterally, and mobilizing it away from the ureter, it was brought up out  of the pelvis.  A couple of small veins were ligated with figure-of-  eight 2-0 silks to get good hemostasis.  This brought the sigmoid colon  up into the area much better.  It was easy to see the long diseased  segment, which did extend from several centimeters up from the  peritoneal reflection to the  mid to distal left colon, with most of the  inflammation in the distal third of the sigmoid.  A healthy place of the  distal third of the left colon was selected, proximal to which there was  no significant diverticular disease.  It was divided with a GIA 75  stapler, and the mesentery was taken down with the LigaSure.  We  continued to hug the bowel, staying away from the ureter; and took down  the mesentery of the sigmoid colon as well with the LigaSure, achieving  excellent hemostasis.  This continued down to below the diseased  segment, which again was approximately 4 cm proximal to the peritoneal  reflection.  Once we achieved that mesenteric division and were  satisfied with our hemostasis.  The colon was divided distally with a  contour stapler.  The specimen was marked for anatomic orientation and  sent to pathology.  Due to the low nature of the distal portion of the  anastomosis, we began  considering the need for doing an end-to-end  anastomosis with EEA stapling device.  At this time, further  mobilization of the left colon was required.  Unfortunately the colon  mesentery was quite foreshortened, and we needed significant further  length.  We continued our mobilization up towards the splenic flexure,  using Bovie cautery and LigaSure under direct vision.  The spleen was  protected throughout.  The splenic flexure was then completely mobilized  and brought down.  We did have to take a little bit more of the  mesentery up that way; this was done with a right angle clamp.  It was  divided on the bowel side with the LigaSure; and then this was tied off  with 2-0 silk sutures, with the first one being flashed.  That did  achieve excellent hemostasis and much better mobilization of the colon.  The artery of Drummond remained intact, and the colon did remain pink  and viable.  The area of mobilization in the left upper quadrant was  copiously irrigated.  Hemostasis was ensured.  At this time we checked  the length of our mobilized colon, and it did reach down into the  pelvis; however, due to the deep nature of the pelvic anastomosis, an  EEA stapler was selected.  The distal end of the left colon was then  cleaned and the appendices were cleared away.  We did remove the distal  2 cm, as it was a little bit dusky.  We placed a pursestring suture  clamp at the end.  We protected the field with laparotomy sponges prior  to dividing that end.  That end was also sent to Pathology.  The 2-0  Prolene suture was run through the pursestring clamp, and the clamp was  removed.  The edges of the colon were pink and very viable, with the  mucosa being nicely intact.  We then used our EEA sizers and only a 25  mm would fit.  The 29 was too large; we did not even try to insert it.  The 25 EEA stapler was opened.  The anvil was taken off the end and  placed inside the distal colon, and the  pursestring Prolene was tied.  The patient's abdomen was protected completely.  His legs were placed  from underneath the drape up and stirrups, maintaining our sterile  field.  His perianal area was prepped and draped in a sterile fashion.  I changed my gloves; my partner did as well and  then went below.  He  first did a rectal exam and then inserted the EEA stapler under my  guidance from above, until it reached an excellent portion in the  anterior part of the remaining rectosigmoid.  The stapler was opened and  the point came through easily.  This was attached to the anvil with a  pursestring; they snapped together nicely.  The stapler was fired and  then removed in standard fashion to complete fold down where it was  revealed.  There was a small amount of venous bleeding at the anterior  portion, and a 2-0 silk figure-of-eight suture ligature was placed --  achieving excellent hemostasis.  The pelvis was filled with saline.  My  assistant put a  proctoscope from below and insufflated the entire left  colon there, including anastomosis; and no air bubbles came out.  The  colon was decompressed and visualized inside with the rigid  sigmoidoscope; no abnormalities were noted.  Irrigation fluid was  evacuated.  The abdomen was copiously irrigated throughout.  The left  upper quadrant was rechecked and hemostasis was assured.  All quadrants  were irrigated.  Irrigation fluid returned clear.  We reinspected the  the colon as it extended down to our anastomosis, and it appeared  viable.  There was a small bleeder on the right lateral side near the  anastomosis.  This was ligated with a 2-0 silk suture ligature,  achieving excellent hemostasis.  The anastomosis remained intact and  again the colon did appear viable and did have peristalsis.  The  remainder of the irrigation fluid was evacuated.  The pelvis was re-  irrigated and evacuated; no further bleeding or other abnormality beyond  the  anastomosis was noted.  The bowel contents were returned to anatomic  position.  The omentum was brought down over the bowel and tucked down  into the pelvis.  The sponge, needle and instrument counts were correct.  The fascia was closed with 2 lengths of running #1 PDS looped suture and  tied in the middle.  Subcutaneous tissues were irrigated and hemostasis  was obtained.  The skin  was closed with staples.  Sponge, needle and instrument counts were  again correct.  Betadine ointment and sterile dressing were applied.  The patient tolerated procedure well without apparent complication.  He  was taken to the recovery room in stable condition.      Gabrielle Dare Janee Morn, M.D.  Electronically Signed     BET/MEDQ  D:  07/17/2007  T:  07/19/2007  Job:  [id number]

## 2010-10-23 NOTE — H&P (Signed)
NAME:  Anthony [name], Anthony [name] NO.:  [id number]   MEDICAL RECORD NO.:  [id number]          PATIENT TYPE:  INP   LOCATION:  5125                         FACILITY:  [facility]   PHYSICIAN:  Ardeth Sportsman, MD     DATE OF BIRTH:  [birth year]   DATE OF ADMISSION:  07/24/2007  DATE OF DISCHARGE:                              HISTORY & PHYSICAL   PRIMARY CARE PHYSICIAN:  Dr. Donia Guiles, M.D.   SURGEON:  Gabrielle Dare. Janee Morn, M.D.   DICTATING SURGEON:  Ardeth Sportsman, MD   REASON FOR ADMISSION:  Sigmoid diverticulitis status post colectomy with  pelvic abscess, possible perforation.   HISTORY OF PRESENT ILLNESS:  Anthony [name] is a pleasant 60 year old  gentleman who underwent a segmental sigmoid colonic resection for  recurrent episodes of diverticulitis.  He was initially evaluated by Dr.  Janee Morn back in October for this.  Although he has had milder attacks,  he developed more severe pain and therefore recommendation was made for  sigmoid colectomy, performed on 07/17/2007.  He was in the hospital for  a few days, but by the time of discharge he was tolerating p.o. well and  had adequate pain control.   At home, he noted he was having more early satiety, bloating and  decreasing flatus.  His appetite is decreased.  He has been having  complaints of some lower back pain, soreness and discomfort.  Based on  concerns, he was seen in clinic.  He was sent for a CT scan, and CT scan  shows a very large pelvic abscess with fluid and air near the  anastomosis suspicious for a leak.   PAST MEDICAL HISTORY:  1. Diverticulosis with recurrent attacks of diverticulitis.  2. Hypercholesterolemia.  3. Chronic constipation.   PAST SURGICAL HISTORY:  He had an open sigmoid colectomy with splenic  flexure mobilization on 07/17/2007.   ALLERGIES:  NONE.   MEDICATIONS:  He takes aspirin, Lipitor, often MiraLax preoperatively  but not recently, multivitamin and Q10.   SOCIAL HISTORY:  He  works for [employer].  He is here today with  his wife at the bedside.  He is a smoker, but no alcohol or drug use.   FAMILY HISTORY:  Noncontributory for early cardiopulmonary disease or  any colon cancer as he can recall.   REVIEW OF SYSTEMS:  Noted as per HPI.  In general, he has had no fevers,  chills or sweats, no change in his weight, but he has had increasing  fatigue.  Psych, neuro, eyes, cardiac, pulmonary, dermatologic, heme,  lymph, hepatic, renal or endocrine are otherwise negative.  GI:  As  noted above, no hematochezia, melena or hematemesis.  Other review of  systems are negative.   PHYSICAL EXAMINATION:  VITAL SIGNS:  His vital signs are noted in the ER  chart and are in the normal range.  GENERAL:  On physical exam, he is a well-developed, well-nourished male  who looks a little pale but not frankly toxic.  PSYCHIATRIC:  He is pleasant, interactive, with at least average  intelligence.  No evidence of any dementia,  delirium, psychosis, or  paranoia.  HEENT:  Eyes:  Pupils equal, round, reactive to light, extraocular  movements are intact, sclerae are not icteric.  They do not seem  particularly injected to me.  NEUROLOGIC:  Cranial nerves II-XII are intact.  Hand grip is 5/5, equal  and symmetrical.  No resting or tension tremors.  NECK:  Supple without any masses.  Trachea is midline.  HEART:  Regular rate and rhythm, no murmurs, gallops or rubs.  CHEST:  Clear to auscultation bilaterally.  No wheezes, rales or  rhonchi.  ABDOMEN:  Very distended but soft.  He has a low midline incision with  staples intact and no evidence of any infection.  He has no tenderness  in his upper abdomen, but in his lower abdomen he has mild to moderate  tenderness.  He is definitely tender in his right flank and his left  lower quadrant with some mild peritoneal signs.  BACK:  He is very tender in the lumbar region.  GENITALIA:  Normal external genitalia, no evidence of any  inguinal  hernias.  MUSCULOSKELETAL:  Full range of motion in the shoulders, elbows, wrists  as well as hips, knees, ankles.  LYMPH:  No head, neck, axillary or groin lymphadenopathy.   LABORATORY DATA:  His white count is 18.3 which is increased with an  obvious left shift.  His hemoglobin is 13.  His INR is 1.2, in the  normal range.  His electrolytes are in the normal range except for a  bilirubin of 1.3 which is slightly elevated, and AST is 39 which is  slightly elevated.  His albumin is only 3.   He had an x-ray with no acute abdomen series which shows no evidence of  pneumonia, mild basilar atelectasis and effusions, but no acute findings  in the abdomen.  He had a CT scan of the abdomen and pelvis which I  reviewed with radiology which shows a very large fluid collection down  around his pelvis with a lot of gas.  It is near his colocolonic  anastomosis down in the left lower quadrant pelvic region.  There is no  evidence of any bowel obstruction or incisional hernias.   ASSESSMENT/PLAN:  A 60 year old gentleman on postop day 7 from sigmoid  colectomy and splenic flexure mobilization with large pelvic fluid and  abscess and abdominal pain concerning for a large postoperative abscess  but with probable anastomotic leak.  Options were discussed and  recommendations were made.  1. Admit.  2. IV fluids.  3. IV Zosyn.  4. Exploratory laparotomy with drainage of pelvic abscess and possible      need for colostomy.   The risks of stroke, heart attack, deep venous thrombosis, pulmonary  embolism and death were discussed.  Risks such as bleeding, need for  transfusion, wound infection, abscess, injury to the organs, prolonged  pain, prolonged hospital stay, ileus, need for later colostomy take down  in three to six months depending on how he is doing and the other risks  were discussed.  Their questions were answered.  He and his wife agreed  to proceed.      Ardeth Sportsman,  MD  Electronically Signed     SCG/MEDQ  D:  07/24/2007  T:  07/27/2007  Job:  [id number]   cc:   Donia Guiles, M.D.

## 2010-10-23 NOTE — H&P (Signed)
NAME:  Anthony [name], Anthony [name] NO.:  [id number]   MEDICAL RECORD NO.:  [id number]          PATIENT TYPE:  INP   LOCATION:  1823                         FACILITY:  [facility]   PHYSICIAN:  Michiel Cowboy, MDDATE OF BIRTH:  05-Oct-1950   DATE OF ADMISSION:  03/29/2007  DATE OF DISCHARGE:                              HISTORY & PHYSICAL   PRIMARY CARE PHYSICIAN:  __________   CHIEF COMPLAINT:  Abdominal pain.   HISTORY OF PRESENT ILLNESS:  This is a 60 year old gentleman with  history of diverticulitis and hyperlipidemia who for the past 1 month  has been struggling with abdominal pain worsening after eating.  Physical exam and his primary care physician felt that this was most  consistent with another episode of diverticulitis.  At the end of  August, he received a course of Flagyl and Cipro.  After completion, he  was feeling somewhat better, but thereafter in the beginning of October,  his symptoms have recurred.  He presented to primary care physician  again on March 23, 2007 and was started again on Flagyl and Cipro but  continued to have a lot of abdominal pain and fevers.  Eventually the  patient presented to the emergency department at [facility].  The  patient was noted to have a white blood cell count of 12.4.  CAT scan  was done showing diverticulitis and intraluminal 2.5-cm abscess.   REVIEW OF SYSTEMS:  Significant for weight loss, occasional headaches,  fevers, and chills.  No chest pain, no shortness of breath.  No nausea,  vomiting, or diarrhea.  No claudication noted.   PAST MEDICAL HISTORY:  1. Hyperlipidemia.  2. Diverticulitis episodes.  3. Constipation.   FAMILY HISTORY:  Noncontributory.   SOCIAL HISTORY:  Positive for current tobacco abuse.  No alcohol abuse.  No drugs.  The patient works at [employer].   ALLERGIES:  NO KNOWN DRUG ALLERGIES.   MEDICATIONS:  1. Patient currently on Cipro and Flagyl.  2. Aspirin 81 mg p.o. daily.  3.  Lipitor 10 mg p.o. daily.  4. MiraLax 1 pack a day.  5. Multivitamin.  6. Q10.   PHYSICAL EXAMINATION:  VITAL SIGNS:  Temperature 98.6, pulse 77,  respirations 18, blood pressure 108/68.  GENERAL:  The patient appears to be in no acute distress, young-  appearing 60 year old.  HEENT:  Moist mucous membranes.  NECK:  No lymphadenopathy.  CHEST:  Clear to auscultation bilaterally.  HEART:  Regular rate and rhythm.  No murmurs, rubs, or gallops.  ABDOMEN:  A bit distended.  No guarding noted.  Abdomen nonacute.  EXTREMITIES:  No clubbing, cyanosis, or edema.  NEUROLOGIC:  Nonfocal.   IMAGING DATA:  CT scan of the abdomen showed diverticulitis plus 2.5-cm  intraluminal wall abscess.  No perforation.   LABORATORY DATA:  Urine with 3-6 white blood cells.  Sodium 136,  potassium 3.8, creatinine 0.8.  Total bilirubin 1.2.  White blood cell  count up to 12.4, H&H 15.8/45.1, platelets 361.   ASSESSMENT AND PLAN:  This is a 60 year old gentleman with repeated  episodes of diverticulitis admitted with another  episode of  diverticulitis and intraluminal abscess.  1. Diverticulitis.  Will make the patient nothing by mouth, hydrate,      start on Zosyn, as the patient has failed Flagyl and Cipro in the      past.  Currently the patient is stable, but we will contact surgery      on Monday to evaluate for possible colectomy, given repeated      episodes of diverticulitis and failure of oral antibiotics.  2. History of hyperlipidemia.  Will continue Lipitor.  3. Prophylaxis.  Protonix and sequential compression devices.   The patient is a full code.      Michiel Cowboy, MD  Electronically Signed     AVD/MEDQ  D:  03/29/2007  T:  03/29/2007  Job:  [id number]

## 2010-10-26 NOTE — Discharge Summary (Signed)
NAME:  Anthony [name], Anthony [name] NO.:  [id number]   MEDICAL RECORD NO.:  [id number]          PATIENT TYPE:  INP   LOCATION:  5126                         FACILITY:  [facility]   PHYSICIAN:  Gabrielle Dare. Janee Morn, M.D.DATE OF BIRTH:  02/07/1951   DATE OF ADMISSION:  07/24/2007  DATE OF DISCHARGE:  08/02/2007                               DISCHARGE SUMMARY   DISCHARGE DIAGNOSES:  1. Anastomotic leak status post sigmoid colectomy.  2. Status post exploratory laparotomy.  3. Partial colectomy and colostomy.   HISTORY OF PRESENT ILLNESS:  Mr. Anthony [name] is a 60 year old gentleman who  underwent a sigmoid colectomy for severe diverticulitis on July 17, 2007.  He had been discharged in stable condition tolerating his diet.  He went on to develop severe lower abdominal pain, came to the emergency  department for evaluation.  A CT scan demonstrated evidence of  disruption of his anastomosis, and he was taken emergently to the  operative room by my partner, Dr. Karie Soda.   HOSPITAL COURSE:  The patient underwent exploratory laparotomy with  takedown anastomosis and colostomy placement.  Postoperatively, he was  maintained on intravenous antibiotics.  He remained hemodynamically  stable.  He was afebrile.  White count which was initially 20,000,  normalized.  His ileus postoperatively gradually resolved.  He was  continued on Zosyn.  Cultures demonstrated Klebsiella, which was  sensitive to ciprofloxacin.  However, he was continued on Zosyn while he  was in hospital.  He tolerated advancement of his diet.  Colostomy  functioned well.  JP drain drainage decreased to minimal.  A followup CT  scan was done demonstrating no obstruction and no remaining abscess  within the abdomen and it also showed almost complete resolution of his  old abscess seen on admission.  He continued to do well, and was  discharged home in stable condition on postoperative day 9.   DISCHARGE DIET:  Regular.   DISCHARGE MEDICATIONS:  Ciprofloxacin 500 mg 1 p.o. b.i.d. for 7 days  for pain.  He will also continue his home medications of:  Lipitor 10 mg  daily, multivitamin supplement, aspirin 81 mg daily, glucosamine daily,  Prilosec 40 mg daily, and docusate 100 mg daily.  Follow up is within 1  week with myself at the office.      Gabrielle Dare Janee Morn, M.D.  Electronically Signed     BET/MEDQ  D:  09/03/2007  T:  09/04/2007  Job:  [id number]

## 2011-03-01 LAB — CBC
HCT: 35.1 — ABNORMAL LOW
HCT: 37.5 — ABNORMAL LOW
HCT: 37.9 — ABNORMAL LOW
HCT: 29.7 — ABNORMAL LOW
HCT: 30 — ABNORMAL LOW
Hemoglobin: 13.4
Hemoglobin: 15.5
Hemoglobin: 14.7
MCHC: 35.3
MCHC: 35.5
MCHC: 34.8
MCHC: 35.1
MCHC: 35.5
MCV: 95
MCV: 95.5
MCV: 95.9
MCV: 95.6
MCV: 95.9
MCV: 96.7
Platelets: 258
Platelets: 438 — ABNORMAL HIGH
Platelets: 375
Platelets: 423 — ABNORMAL HIGH
RBC: 3.96 — ABNORMAL LOW
RBC: 4.63
RBC: 3.09 — ABNORMAL LOW
RBC: 3.14 — ABNORMAL LOW
RBC: 4.35
RDW: 12.1
WBC: 10.3
WBC: 13.1 — ABNORMAL HIGH
WBC: 11.3 — ABNORMAL HIGH
WBC: 12.3 — ABNORMAL HIGH
WBC: 14.3 — ABNORMAL HIGH
WBC: 17.4 — ABNORMAL HIGH

## 2011-03-01 LAB — CULTURE, ROUTINE-ABSCESS

## 2011-03-01 LAB — BASIC METABOLIC PANEL
BUN: 10
BUN: 9
CO2: 26
CO2: 26
CO2: 26
Calcium: 8.3 — ABNORMAL LOW
Calcium: 8.3 — ABNORMAL LOW
Chloride: 102
Chloride: 103
Chloride: 100
Chloride: 103
Creatinine, Ser: 0.71
Creatinine, Ser: 0.74
Creatinine, Ser: 0.86
GFR calc Af Amer: >60
GFR calc non Af Amer: >60
GFR calc non Af Amer: 59 — ABNORMAL LOW
GFR calc non Af Amer: >60
Glucose, Bld: 172 — ABNORMAL HIGH
Glucose, Bld: 94
Glucose, Bld: 137 — ABNORMAL HIGH
Glucose, Bld: 177 — ABNORMAL HIGH
Potassium: 4.2
Potassium: 4.1
Potassium: 4.7
Sodium: 133 — ABNORMAL LOW
Sodium: 132 — ABNORMAL LOW
Sodium: 133 — ABNORMAL LOW
Sodium: 135

## 2011-03-01 LAB — DIFFERENTIAL
Lymphocytes Relative: 8 — ABNORMAL LOW
Lymphs Abs: 1.5
Monocytes Absolute: 1.2 — ABNORMAL HIGH
Monocytes Relative: 7
Neutro Abs: 15.4 — ABNORMAL HIGH

## 2011-03-01 LAB — COMPREHENSIVE METABOLIC PANEL
AST: 39 — ABNORMAL HIGH
Albumin: 3 — ABNORMAL LOW
Calcium: 8.7
Creatinine, Ser: 0.74

## 2011-03-01 LAB — ANAEROBIC CULTURE

## 2011-03-01 LAB — IRON AND TIBC

## 2011-03-01 LAB — HEMOGLOBIN AND HEMATOCRIT, BLOOD: HCT: 37.5 — ABNORMAL LOW

## 2011-03-04 LAB — COMPREHENSIVE METABOLIC PANEL
Albumin: 3.7
BUN: 6
Creatinine, Ser: 0.71
Total Bilirubin: 0.6
Total Protein: 7.4

## 2011-03-04 LAB — URINALYSIS, ROUTINE W REFLEX MICROSCOPIC
Bilirubin Urine: NEGATIVE
Nitrite: NEGATIVE
Specific Gravity, Urine: 1.011
Urobilinogen, UA: 0.2

## 2011-03-04 LAB — PROTIME-INR: INR: 1.1

## 2011-03-04 LAB — CBC
HCT: 37.9 — ABNORMAL LOW
MCHC: 34.1
MCV: 92
Platelets: 560 — ABNORMAL HIGH
RDW: 13.2

## 2011-03-04 LAB — APTT: aPTT: 31

## 2011-03-20 LAB — PROTIME-INR
INR: 1.2
Prothrombin Time: 15.9 — ABNORMAL HIGH

## 2011-03-20 LAB — DIFFERENTIAL
Basophils Absolute: 0
Basophils Relative: 0
Eosinophils Absolute: 0.2
Neutro Abs: 9 — ABNORMAL HIGH
Neutrophils Relative %: 73

## 2011-03-20 LAB — BASIC METABOLIC PANEL
BUN: 6
CO2: 24
Chloride: 104
Creatinine, Ser: 0.77
GFR calc Af Amer: >60
Potassium: 3.7

## 2011-03-20 LAB — TYPE AND SCREEN
ABO/RH(D): O POS
Antibody Screen: NEGATIVE

## 2011-03-20 LAB — URINALYSIS, ROUTINE W REFLEX MICROSCOPIC
Hgb urine dipstick: NEGATIVE
Ketones, ur: 40 — AB
Protein, ur: NEGATIVE
Urobilinogen, UA: 0.2

## 2011-03-20 LAB — CBC
HCT: 36.2 — ABNORMAL LOW
HCT: 39.5
HCT: 45.1
Hemoglobin: 15.8
MCHC: 35.1
MCHC: 35.3
MCHC: 35.6
MCV: 93.4
MCV: 94.9
Platelets: 342
Platelets: 364
RBC: 4.16 — ABNORMAL LOW
RBC: 4.51
RBC: 4.74
RDW: 11.8
WBC: 11.3 — ABNORMAL HIGH
WBC: 12.4 — ABNORMAL HIGH

## 2011-03-20 LAB — COMPREHENSIVE METABOLIC PANEL
ALT: 21
Albumin: 2.8 — ABNORMAL LOW
Alkaline Phosphatase: 86
BUN: 2 — ABNORMAL LOW
BUN: 7
CO2: 25
Calcium: 8.8
Chloride: 102
Creatinine, Ser: 0.81
Glucose, Bld: 114 — ABNORMAL HIGH
Glucose, Bld: 94
Potassium: 3.7
Potassium: 3.9
Total Bilirubin: 1.2
Total Protein: 6

## 2011-03-20 LAB — CULTURE, BLOOD (ROUTINE X 2)

## 2011-03-20 LAB — OCCULT BLOOD X 1 CARD TO LAB, STOOL
Fecal Occult Bld: NEGATIVE
Fecal Occult Bld: POSITIVE

## 2011-03-20 LAB — URINE MICROSCOPIC-ADD ON

## 2012-03-12 ENCOUNTER — Other Ambulatory Visit: Payer: Self-pay | Admitting: Gastroenterology

## 2012-08-08 ENCOUNTER — Ambulatory Visit (INDEPENDENT_AMBULATORY_CARE_PROVIDER_SITE_OTHER): Payer: Managed Care, Other (non HMO) | Admitting: Family Medicine

## 2012-08-08 VITALS — BP 124/79 | HR 70 | Temp 97.8°F | Resp 16 | Ht 74.0 in | Wt 231.0 lb

## 2012-08-08 DIAGNOSIS — K219 Gastro-esophageal reflux disease without esophagitis: Secondary | ICD-10-CM

## 2012-08-08 DIAGNOSIS — J322 Chronic ethmoidal sinusitis: Secondary | ICD-10-CM

## 2012-08-08 DIAGNOSIS — J329 Chronic sinusitis, unspecified: Secondary | ICD-10-CM

## 2012-08-08 DIAGNOSIS — E7800 Pure hypercholesterolemia, unspecified: Secondary | ICD-10-CM | POA: Insufficient documentation

## 2012-08-08 MED ORDER — CEFDINIR 300 MG PO CAPS: 300.0000 mg | ORAL_CAPSULE | Freq: Two times a day (BID) | ORAL | Status: DC

## 2012-08-08 NOTE — Progress Notes (Signed)
[facility] [address], [place] [address] [phone]  Date:  08/08/2012   Name:  Anthony [name]   DOB:  11/30/1950   MRN:  [id number]  PCP:  No primary provider on file.    Chief Complaint: Sinusitis   History of Present Illness:  Anthony [name] is a 62 y.o. very pleasant male patient who presents with the following:  He has noted sinus congestion for 2 or 3 weeks- he has now developed some facial pain and tooth pain over the last 2 or 3 days He does not have a ST but it is not very severe He does have a cough and chest congestion a swell He has noted possible fever and chills, but is using tylenol as needed.    He had extensive bowel surgery in 2009/ 2010- he had a temporarily colostomy bag but is now doing well. 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  However, he does not have any other current bowel problems and is able to take abx without problems generally No diarrhea    There is no problem list on file for this patient.   Past Medical History  Diagnosis Date  . Allergy   . Arthritis   . Neuromuscular disorder     Past Surgical History  Procedure Laterality Date  . Colon surgery    . Hernia repair    . Vasectomy    . Small intestine surgery      History  Substance Use Topics  . Smoking status: Current Every Day Smoker  . Smokeless tobacco: Not on file  . Alcohol Use: Yes    Family History  Problem Relation Age of Onset  . Cancer Mother     Allergies  Allergen Reactions  . Citrus     Medication list has been reviewed and updated.  No current outpatient prescriptions on file prior to visit.   No current facility-administered medications on file prior to visit.    Review of Systems:  As per HPI- otherwise negative.   Physical Examination: Filed Vitals:   08/08/12 1202  BP: 124/79  Pulse: 76  Temp: 97.8 F (36.6 C)  Resp: 16   Filed Vitals:    08/08/12 1202  Height: 6\' 2"  (1.88 m)  Weight: 231 lb (104.781 kg)   Body mass index is 29.65 kg/(m^2). Ideal Body Weight: Weight in (lb) to have BMI = 25: 194.3  GEN: WDWN, NAD, Non-toxic, A & O x 3 HEENT: Atraumatic, Normocephalic. Neck supple. No masses, No LAD.  Bilateral TM wnl, oropharynx normal.  PEERL,EOMI.   Sinus tenderness and nasal congestion/ purulent discharge Ears and Nose: No external deformity. CV: RRR, No M/G/R. No JVD. No thrill. No extra heart sounds. PULM: CTA B, no wheezes, crackles, rhonchi. No retractions. No resp. distress. No accessory muscle use. ABD: S, NT, abdomen is slightly protuberant due to his multiple surgeries and loss of muscle integrity. No rebound. No HSM. EXTR: No c/c/e NEURO Normal gait.  PSYCH: Normally interactive. Conversant. Not depressed or anxious appearing.  Calm demeanor.    Assessment and Plan: Ethmoid sinusitis - Plan: cefdinir (OMNICEF) 300 MG capsule  High cholesterol  GERD (gastroesophageal reflux disease)  Treat for sinuitis with omnicef- encouraged probiotics given his past bowel history.   See patient instructions for more details.     Abbe Amsterdam, MD

## 2012-08-08 NOTE — Patient Instructions (Addendum)
Use the antibiotic as directed, and drink plenty of fluids.  If you are not getting better in the next few days please let me know- Sooner if worse.

## 2013-05-27 ENCOUNTER — Other Ambulatory Visit: Payer: Self-pay | Admitting: Physician Assistant

## 2013-05-27 ENCOUNTER — Ambulatory Visit
Admission: RE | Admit: 2013-05-27 | Discharge: 2013-05-27 | Disposition: A | Discharge status: Home / Self Care | Payer: 59 | Source: Ambulatory Visit | Attending: Physician Assistant | Admitting: Physician Assistant

## 2013-05-27 DIAGNOSIS — R52 Pain, unspecified: Secondary | ICD-10-CM

## 2013-07-09 ENCOUNTER — Other Ambulatory Visit: Payer: Self-pay | Admitting: Family Medicine

## 2013-07-09 ENCOUNTER — Ambulatory Visit
Admission: RE | Admit: 2013-07-09 | Discharge: 2013-07-09 | Disposition: A | Discharge status: Home / Self Care | Payer: 59 | Source: Ambulatory Visit | Attending: Family Medicine | Admitting: Family Medicine

## 2013-07-09 DIAGNOSIS — R9389 Abnormal findings on diagnostic imaging of other specified body structures: Secondary | ICD-10-CM

## 2015-03-08 ENCOUNTER — Other Ambulatory Visit: Payer: Self-pay | Admitting: Family Medicine

## 2015-03-08 ENCOUNTER — Ambulatory Visit
Admission: RE | Admit: 2015-03-08 | Discharge: 2015-03-08 | Disposition: A | Discharge status: Home / Self Care | Payer: 59 | Source: Ambulatory Visit | Attending: Family Medicine | Admitting: Family Medicine

## 2015-03-08 DIAGNOSIS — R062 Wheezing: Secondary | ICD-10-CM

## 2015-03-08 DIAGNOSIS — R0789 Other chest pain: Secondary | ICD-10-CM

## 2017-01-29 ENCOUNTER — Other Ambulatory Visit: Payer: Self-pay | Admitting: Family Medicine

## 2017-01-29 DIAGNOSIS — R1012 Left upper quadrant pain: Secondary | ICD-10-CM

## 2017-01-29 DIAGNOSIS — R109 Unspecified abdominal pain: Secondary | ICD-10-CM

## 2017-02-05 ENCOUNTER — Ambulatory Visit
Admission: RE | Admit: 2017-02-05 | Discharge: 2017-02-05 | Disposition: A | Discharge status: Home / Self Care | Payer: 59 | Source: Ambulatory Visit | Attending: Family Medicine | Admitting: Family Medicine

## 2017-02-05 DIAGNOSIS — R1012 Left upper quadrant pain: Secondary | ICD-10-CM

## 2017-02-05 DIAGNOSIS — R109 Unspecified abdominal pain: Secondary | ICD-10-CM

## 2018-01-21 ENCOUNTER — Other Ambulatory Visit: Payer: Self-pay | Admitting: Family Medicine

## 2018-01-21 DIAGNOSIS — K7689 Other specified diseases of liver: Secondary | ICD-10-CM

## 2018-01-31 ENCOUNTER — Ambulatory Visit
Admission: RE | Admit: 2018-01-31 | Discharge: 2018-01-31 | Disposition: A | Discharge status: Home / Self Care | Payer: 59 | Source: Ambulatory Visit | Attending: Family Medicine | Admitting: Family Medicine

## 2018-01-31 DIAGNOSIS — K7689 Other specified diseases of liver: Secondary | ICD-10-CM

## 2018-01-31 MED ORDER — GADOBENATE DIMEGLUMINE 529 MG/ML IV SOLN
20.0000 mL | Freq: Once | INTRAVENOUS | Status: AC | PRN
  Administered 2018-01-31: 20 mL via INTRAVENOUS

## 2019-04-21 ENCOUNTER — Other Ambulatory Visit: Payer: Self-pay | Admitting: Family Medicine

## 2019-06-30 ENCOUNTER — Ambulatory Visit: Payer: 59 | Attending: Internal Medicine

## 2019-06-30 DIAGNOSIS — Z23 Encounter for immunization: Secondary | ICD-10-CM | POA: Insufficient documentation

## 2019-06-30 NOTE — Progress Notes (Signed)
   Covid-19 Vaccination Clinic  Name:  Anthony [name]    MRN: [id number] DOB: 19-Jul-1950  06/30/2019  Anthony [name] was observed post Covid-19 immunization for 15 minutes without incidence. He was provided with Vaccine Information Sheet and instruction to access the V-Safe system.   Anthony [name] was instructed to call 911 with any severe reactions post vaccine: Marland Kitchen Difficulty breathing  . Swelling of your face and throat  . A fast heartbeat  . A bad rash all over your body  . Dizziness and weakness    Immunizations Administered    Name Date Dose VIS Date Route   Pfizer COVID-19 Vaccine 06/30/2019  5:11 PM 0.3 mL 05/21/2019 Intramuscular   Manufacturer: [employer], [employer]   Lot: [id number]   NDC: [address]-1

## 2019-07-18 ENCOUNTER — Ambulatory Visit: Payer: 59 | Attending: Internal Medicine

## 2019-07-18 DIAGNOSIS — Z23 Encounter for immunization: Secondary | ICD-10-CM | POA: Insufficient documentation

## 2019-07-18 NOTE — Progress Notes (Signed)
   Covid-19 Vaccination Clinic  Name:  Anthony [name]    MRN: [id number] DOB: 1950/07/28  07/18/2019  Mr. Anthony [name] was observed post Covid-19 immunization for 30 minutes based on pre-vaccination screening without incidence. He was provided with Vaccine Information Sheet and instruction to access the V-Safe system.   Mr. Anthony [name] was instructed to call 911 with any severe reactions post vaccine: Marland Kitchen Difficulty breathing  . Swelling of your face and throat  . A fast heartbeat  . A bad rash all over your body  . Dizziness and weakness    Immunizations Administered    Name Date Dose VIS Date Route   Pfizer COVID-19 Vaccine 07/18/2019 12:30 PM 0.3 mL 05/21/2019 Intramuscular   Manufacturer: [employer], [employer]   Lot: [id number]   NDC: [address]-2

## 2020-10-26 ENCOUNTER — Other Ambulatory Visit: Payer: Self-pay

## 2020-10-26 DIAGNOSIS — N281 Cyst of kidney, acquired: Secondary | ICD-10-CM

## 2020-10-27 ENCOUNTER — Ambulatory Visit
Admission: RE | Admit: 2020-10-27 | Discharge: 2020-10-27 | Disposition: A | Discharge status: Home / Self Care | Payer: 59 | Source: Ambulatory Visit | Attending: Family Medicine | Admitting: Family Medicine

## 2020-10-27 DIAGNOSIS — N281 Cyst of kidney, acquired: Secondary | ICD-10-CM

## 2021-04-26 ENCOUNTER — Other Ambulatory Visit: Payer: Self-pay | Admitting: Family Medicine

## 2021-04-26 DIAGNOSIS — N281 Cyst of kidney, acquired: Secondary | ICD-10-CM

## 2021-05-10 ENCOUNTER — Ambulatory Visit
Admission: RE | Admit: 2021-05-10 | Discharge: 2021-05-10 | Disposition: A | Discharge status: Home / Self Care | Payer: Medicare Other | Source: Ambulatory Visit | Attending: Family Medicine | Admitting: Family Medicine

## 2021-05-10 DIAGNOSIS — N281 Cyst of kidney, acquired: Secondary | ICD-10-CM

## 2021-07-16 ENCOUNTER — Other Ambulatory Visit: Payer: Self-pay

## 2021-07-16 ENCOUNTER — Ambulatory Visit
Admission: RE | Admit: 2021-07-16 | Discharge: 2021-07-16 | Disposition: A | Discharge status: Home / Self Care | Payer: Medicare Other | Source: Ambulatory Visit | Attending: Family Medicine | Admitting: Family Medicine

## 2021-07-16 ENCOUNTER — Other Ambulatory Visit: Payer: Self-pay | Admitting: Family Medicine

## 2021-07-16 DIAGNOSIS — R059 Cough, unspecified: Secondary | ICD-10-CM

## 2021-10-07 ENCOUNTER — Emergency Department (HOSPITAL_BASED_OUTPATIENT_CLINIC_OR_DEPARTMENT_OTHER)
Admission: EM | Admit: 2021-10-07 | Discharge: 2021-10-07 | Disposition: A | Discharge status: Home / Self Care | Payer: Medicare Other | Attending: Emergency Medicine | Admitting: Emergency Medicine

## 2021-10-07 ENCOUNTER — Other Ambulatory Visit: Payer: Self-pay

## 2021-10-07 ENCOUNTER — Emergency Department (HOSPITAL_BASED_OUTPATIENT_CLINIC_OR_DEPARTMENT_OTHER): Payer: Medicare Other

## 2021-10-07 ENCOUNTER — Encounter (HOSPITAL_BASED_OUTPATIENT_CLINIC_OR_DEPARTMENT_OTHER): Payer: Self-pay | Admitting: Emergency Medicine

## 2021-10-07 DIAGNOSIS — M25562 Pain in left knee: Secondary | ICD-10-CM | POA: Diagnosis present

## 2021-10-07 MED ORDER — ACETAMINOPHEN 325 MG PO TABS: 650.0000 mg | ORAL_TABLET | Freq: Once | ORAL | Status: DC

## 2021-10-07 NOTE — ED Triage Notes (Signed)
/  Patient states that he was putting cabinet up yesterday and he stuck his leg out and he felt something pop.Mild swelling. ?

## 2021-10-07 NOTE — Discharge Instructions (Signed)
Please read and follow all provided instructions. ? ?Your diagnoses today include:  ?1. Acute pain of left knee   ? ? ?Tests performed today include: ?An x-ray of the affected area - does NOT show any broken bones ?Vital signs. See below for your results today.  ? ?Medications prescribed:  ?None ? ?Take any prescribed medications only as directed. ? ?Home care instructions:  ?Follow any educational materials contained in this packet ?Follow R.I.C.E. Protocol: ?R - rest your injury ? I  - use ice on injury without applying directly to skin ?C - compress injury with bandage or splint ?E - elevate the injury as much as possible ? ?Follow-up instructions: ?Please follow-up with your personal orthopedist or the sports medicine referral given to you in the next 1 week for recheck. ? ?Return instructions:  ?Please return if your toes or feet are numb or tingling, appear gray or blue, or you have severe pain (also elevate the leg and loosen splint or wrap if you were given one) ?Please return to the Emergency Department if you experience worsening symptoms.  ?Please return if you have any other emergent concerns. ? ?Additional Information: ? ?Your vital signs today were: ?BP (!) 154/93   Pulse 74   Temp 98.2 ?F (36.8 ?C) (Oral)   Resp 16   Ht 6\' 5"  (1.956 m)   Wt 102.1 kg   SpO2 96%   BMI 26.68 kg/m?  ?If your blood pressure (BP) was elevated above 135/85 this visit, please have this repeated by your doctor within one month. ? ? ?

## 2021-10-07 NOTE — ED Provider Notes (Signed)
[facility] EMERGENCY DEPARTMENT Provider Note   CSN: [id number] Arrival date & time: 10/07/21  4098     History  Chief Complaint  Patient presents with   Knee Pain    Anthony [name] is a 71 y.o. male.  Patient presents to the emergency department today for evaluation of lateral left knee pain.  Symptoms started acutely yesterday when he was working on cabinetry in his house.  He states that he was squatting down and put his left leg out to the side.  When he pushed off he "felt a pop".  He has had pain at that time.  He is ambulatory.  Minimal swelling reported.  No treatments prior to arrival except for application of a knee compression sleeve.  No weakness, numbness, or tingling distally.      Home Medications Prior to Admission medications   Medication Sig Start Date End Date Taking? Authorizing Provider  atorvastatin (LIPITOR) 10 MG tablet Take 10 mg by mouth daily.   Yes [provider]  omeprazole (PRILOSEC) 10 MG capsule Take 10 mg by mouth daily.   Yes [provider]  cefdinir (OMNICEF) 300 MG capsule Take 1 capsule (300 mg total) by mouth 2 (two) times daily. 08/08/12   Copland, Gwenlyn Found, MD      Allergies    Citrus and Hydromorphone    Review of Systems   Review of Systems  Physical Exam Updated Vital Signs BP (!) 154/93   Pulse 74   Temp 98.2 F (36.8 C) (Oral)   Resp 16   Ht 6\' 5"  (1.956 m)   Wt 102.1 kg   SpO2 96%   BMI 26.68 kg/m  Physical Exam Vitals and nursing note reviewed.  Constitutional:      Appearance: He is well-developed.  HENT:     Head: Normocephalic and atraumatic.  Eyes:     Conjunctiva/sclera: Conjunctivae normal.  Cardiovascular:     Pulses: Normal pulses. No decreased pulses.  Musculoskeletal:        General: Tenderness present.     Cervical back: Normal range of motion and neck supple.     Left knee: Normal range of motion. Tenderness present over the lateral joint line. No medial joint line  tenderness. LCL laxity (suspect mild) present.     Right lower leg: No edema.     Left lower leg: No edema.  Skin:    General: Skin is warm and dry.  Neurological:     Mental Status: He is alert.     Sensory: No sensory deficit.     Comments: Motor, sensation, and vascular distal to the injury is fully intact.   Psychiatric:        Mood and Affect: Mood normal.    ED Results / Procedures / Treatments   Labs (all labs ordered are listed, but only abnormal results are displayed) Labs Reviewed - No data to display  EKG None  Radiology No results found.  Procedures Procedures    Medications Ordered in ED Medications - No data to display  ED Course/ Medical Decision Making/ A&P    Patient seen and examined. History obtained directly from patient.   Labs/EKG: None ordered  Imaging: X-ray of the left knee  Medications/Fluids: None ordered  Most recent vital signs reviewed and are as follows: BP (!) 154/93   Pulse 74   Temp 98.2 F (36.8 C) (Oral)   Resp 16   Ht 6\' 5"  (1.956 m)   Wt 102.1 kg  SpO2 96%   BMI 26.68 kg/m   Initial impression: Lateral knee pain  10:17 AM Reassessment performed. Patient appears stable.  Imaging personally visualized and interpreted including: X-ray, agree no fracture  Reviewed pertinent lab work and imaging with patient at bedside. Questions answered.   Most current vital signs reviewed and are as follows: BP (!) 154/93   Pulse 74   Temp 98.2 F (36.8 C) (Oral)   Resp 16   Ht 6\' 5"  (1.956 m)   Wt 102.1 kg   SpO2 96%   BMI 26.68 kg/m   Plan: Discharge to home.   Prescriptions written for: None  Other home care instructions discussed: RICE protocol, Tylenol (patient unable to take NSAIDs)  ED return instructions discussed: Worsening or other concerns  Follow-up instructions discussed: Encouraged to follow-up with their PCP or sports medicine referral.                              Medical Decision  Making Amount and/or Complexity of Data Reviewed Radiology: ordered.   Patient with lateral knee pain, most suspicious of lateral ligamentous injury, however meniscal injury not ruled out.  Patient is ambulatory.  Feel that he can be weightbearing as tolerated at this time.  Strongly encouraged orthopedic follow-up.  No signs of neurovascular compromise or compartment syndrome.         Final Clinical Impression(s) / ED Diagnoses Final diagnoses:  Acute pain of left knee    Rx / DC Orders ED Discharge Orders     None         , PA-C 10/07/21 1019    10/09/21, MD 10/26/21 2157

## 2022-01-22 ENCOUNTER — Other Ambulatory Visit: Payer: Self-pay | Admitting: Family Medicine

## 2022-01-22 DIAGNOSIS — I77811 Abdominal aortic ectasia: Secondary | ICD-10-CM

## 2022-01-24 ENCOUNTER — Other Ambulatory Visit: Payer: Medicare Other

## 2022-01-28 ENCOUNTER — Ambulatory Visit
Admission: RE | Admit: 2022-01-28 | Discharge: 2022-01-28 | Disposition: A | Discharge status: Home / Self Care | Payer: Medicare Other | Source: Ambulatory Visit | Attending: Family Medicine | Admitting: Family Medicine

## 2022-01-28 DIAGNOSIS — I77811 Abdominal aortic ectasia: Secondary | ICD-10-CM

## 2022-06-11 ENCOUNTER — Other Ambulatory Visit: Payer: Self-pay | Admitting: Physician Assistant

## 2022-06-11 DIAGNOSIS — Z122 Encounter for screening for malignant neoplasm of respiratory organs: Secondary | ICD-10-CM

## 2022-07-15 ENCOUNTER — Other Ambulatory Visit: Payer: Medicare Other

## 2022-09-30 ENCOUNTER — Other Ambulatory Visit: Payer: Self-pay | Admitting: Sports Medicine

## 2022-09-30 ENCOUNTER — Ambulatory Visit
Admission: RE | Admit: 2022-09-30 | Discharge: 2022-09-30 | Disposition: A | Discharge status: Home / Self Care | Payer: Medicare Other | Source: Ambulatory Visit | Attending: Sports Medicine | Admitting: Sports Medicine

## 2022-09-30 DIAGNOSIS — R0789 Other chest pain: Secondary | ICD-10-CM

## 2023-04-03 NOTE — Progress Notes (Unsigned)
Cardiology Office Note:    Date:  04/15/2023   ID:  Anthony [name], DOB 1950-12-03, MRN [id number]  PCP:  Anthony Raider, MD   [facility] Providers Cardiologist:  None     Referring MD: Anthony Raider, MD   Chief Complaint  Patient presents with   Coronary Artery Disease   Chest Pain    History of Present Illness:    Anthony [name] is a 72 y.o. male seen at the request of Dr Anthony [name] for evaluation of new incomplete RBBB and CAD. He had CT chest for cancer screening in January showing coronary calcification in the LAD and aortic atherosclerosis. He does report that he gets SOB with exertion and over the past several months has noted right anterior chest pain with exertion such as playing golf. No history of DM, HTN or family history of CAD.   Past Medical History:  Diagnosis Date   Allergy    Arthritis    Hyperlipidemia    Rotator cuff disorder 06/10/1998   surgery     Past Surgical History:  Procedure Laterality Date   COLON SURGERY     copd     HERNIA REPAIR     SMALL INTESTINE SURGERY     VASECTOMY      Current Medications: Current Meds  Medication Sig   atorvastatin (LIPITOR) 20 MG tablet Take 1 tablet (20 mg total) by mouth daily.   omeprazole (PRILOSEC) 10 MG capsule Take 10 mg by mouth daily.   [DISCONTINUED] atorvastatin (LIPITOR) 10 MG tablet Take 10 mg by mouth daily.   [DISCONTINUED] cefdinir (OMNICEF) 300 MG capsule Take 1 capsule (300 mg total) by mouth 2 (two) times daily.     Allergies:   Citrus and Hydromorphone   Social History   Socioeconomic History   Marital status: Married    Spouse name: Not on file   Number of children: 2   Years of education: Not on file   Highest education level: Not on file  Occupational History   Not on file  Tobacco Use   Smoking status: Every Day   Smokeless tobacco: Not on file  Substance and Sexual Activity   Alcohol use: Yes   Drug use: Not on file   Sexual activity: Not on file  Other  Topics Concern   Not on file  Social History Narrative   Retired [employer] and [employer].    Social Determinants of Health   Financial Resource Strain: Not on file  Food Insecurity: Not on file  Transportation Needs: Not on file  Physical Activity: Not on file  Stress: Not on file  Social Connections: Unknown (02/17/2023)   Received from [facility]   Social Network    Social Network: Not on file     Family History: The patient's family history includes Breast cancer in his mother; Cancer in his mother.  ROS:   Please see the history of present illness.     All other systems reviewed and are negative.  EKGs/Labs/Other Studies Reviewed:    The following studies were reviewed today: EKG Interpretation Date/Time:  Tuesday April 15 2023 14:36:16 EST Ventricular Rate:  61 PR Interval:  164 QRS Duration:  108 QT Interval:  420 QTC Calculation: 422 R Axis:   -19  Text Interpretation: Normal sinus rhythm Incomplete right bundle branch block When compared with ECG of 15-Jul-2007 14:47, No significant change was found Confirmed by [name], Anthony [name] [phone]) on 04/15/2023 2:53:48 PM   EKG Interpretation Date/Time:  Tuesday April 15 2023 14:36:16 EST Ventricular Rate:  61 PR Interval:  164 QRS Duration:  108 QT Interval:  420 QTC Calculation: 422 R Axis:   -19  Text Interpretation: Normal sinus rhythm Incomplete right bundle branch block When compared with ECG of 15-Jul-2007 14:47, No significant change was found Confirmed by [name], Anthony [name] [phone]) on 04/15/2023 2:53:48 PM    Recent Labs: No results found for requested labs within last 365 days.  Recent Lipid Panel No results found for: "CHOL", "TRIG", "HDL", "CHOLHDL", "VLDL", "LDLCALC", "LDLDIRECT" Dated 02/14/23: cholesterol 182, triglycerides 169, HDL 36, LDL 116. A1c 5.7%. CMET normal  Risk Assessment/Calculations:                Physical Exam:    VS:  BP 130/70 (BP Location: Left Arm, Patient Position: Sitting, Cuff  Size: Normal)   Pulse 61   Ht 6\' 1"  (1.854 m)   Wt 214 lb (97.1 kg)   SpO2 96%   BMI 28.23 kg/m     Wt Readings from Last 3 Encounters:  04/15/23 214 lb (97.1 kg)  10/07/21 225 lb (102.1 kg)  08/08/12 231 lb (104.8 kg)     GEN:  Well nourished, well developed in no acute distress HEENT: Normal NECK: No JVD; No carotid bruits LYMPHATICS: No lymphadenopathy CARDIAC: RRR, no murmurs, rubs, gallops RESPIRATORY:  Clear to auscultation without rales, wheezing or rhonchi  ABDOMEN: Soft, non-tender, non-distended MUSCULOSKELETAL:  No edema; No deformity  SKIN: Warm and dry NEUROLOGIC:  Alert and oriented x 3 PSYCHIATRIC:  Normal affect   ASSESSMENT:    1. Chest pain, unspecified type   2. High cholesterol   3. Chest pain of uncertain etiology   4. Coronary artery calcification   5. Tobacco abuse    PLAN:    In order of problems listed above:  Exertional chest tightness and DOE. Unclear if this is related to his tobacco abuse/COPD versus angina. Recommend coronary CTA to assess. Check BMET.  Tobacco abuse. Encourage complete cessation HLD. With coronary calcification recommend target LDL < 70. Will increase lipitor to 20 mg daily. Follow up labs with Dr Anthony [name] in 3 months Incomplete RBBB. No conduction delay. This is benign and a normal variant.            Medication Adjustments/Labs and Tests Ordered: Current medicines are reviewed at length with the patient today.  Concerns regarding medicines are outlined above.  Orders Placed This Encounter  Procedures   EKG 12-Lead   Meds ordered this encounter  Medications   atorvastatin (LIPITOR) 20 MG tablet    Sig: Take 1 tablet (20 mg total) by mouth daily.    Dispense:  90 tablet    Refill:  3    There are no Patient Instructions on file for this visit.   Signed, Anthony Terra [place], MD  04/15/2023 3:13 PM    [facility]

## 2023-04-15 ENCOUNTER — Ambulatory Visit: Payer: Medicare Other | Attending: Cardiology | Admitting: Cardiology

## 2023-04-15 ENCOUNTER — Encounter: Payer: Self-pay | Admitting: Cardiology

## 2023-04-15 VITALS — BP 130/70 | HR 61 | Ht 73.0 in | Wt 214.0 lb

## 2023-04-15 DIAGNOSIS — R079 Chest pain, unspecified: Secondary | ICD-10-CM | POA: Diagnosis not present

## 2023-04-15 DIAGNOSIS — E7800 Pure hypercholesterolemia, unspecified: Secondary | ICD-10-CM | POA: Diagnosis not present

## 2023-04-15 DIAGNOSIS — I2510 Atherosclerotic heart disease of native coronary artery without angina pectoris: Secondary | ICD-10-CM | POA: Diagnosis not present

## 2023-04-15 DIAGNOSIS — Z720 Tobacco use: Secondary | ICD-10-CM

## 2023-04-15 MED ORDER — METOPROLOL TARTRATE 50 MG PO TABS
50.0000 mg | ORAL_TABLET | Freq: Two times a day (BID) | ORAL | 0 refills | Status: DC
Start: 2023-04-15 — End: 2024-04-16

## 2023-04-15 MED ORDER — ATORVASTATIN CALCIUM 20 MG PO TABS: 20.0000 mg | ORAL_TABLET | Freq: Every day | ORAL | 3 refills | Status: DC

## 2023-04-15 NOTE — Patient Instructions (Signed)
Medication Instructions:  Metoprolol tartrate 50 mg 2 hours before CT *If you need a refill on your cardiac medications before your next appointment, please call your pharmacy*   Lab Work: BMET If you have labs (blood work) drawn today and your tests are completely normal, you will receive your results only by: MyChart Message (if you have MyChart) OR A paper copy in the mail If you have any lab test that is abnormal or we need to change your treatment, we will call you to review the results.   Testing/Procedures: Coronary CTA    Your cardiac CT will be scheduled at one of the below locations:   [facility] [address] [place], [place] [address] [phone]    If scheduled at [facility], please arrive at the [facility] and Children's Entrance (Entrance C2) of [facility] 30 minutes prior to test start time. You can use the FREE valet parking offered at entrance C (encouraged to control the heart rate for the test)  Proceed to the [facility] Radiology Department (first floor) to check-in and test prep.  All radiology patients and guests should use entrance C2 at [facility], accessed from [facility], even though the hospital's physical address listed is [address]    If scheduled at [facility] or [facility], please arrive 15 mins early for check-in and test prep.  There is spacious parking and easy access to the radiology department from the [facility] Heart and Vascular entrance. Please enter here and check-in with the desk attendant.   Please follow these instructions carefully (unless otherwise directed):  An IV will be required for this test and Nitroglycerin will be given.  Hold all erectile dysfunction medications at least 3 days (72 hrs) prior to test. (Ie viagra, cialis, sildenafil, tadalafil, etc)   On the Night Before the Test: Be sure to Drink plenty of  water. Do not consume any caffeinated/decaffeinated beverages or chocolate 12 hours prior to your test. Do not take any antihistamines 12 hours prior to your test. If the patient has contrast allergy:   On the Day of the Test: Drink plenty of water until 1 hour prior to the test. Do not eat any food 1 hour prior to test. You may take your regular medications prior to the test.  Take metoprolol tartrate 50 mg  two hours prior to test.    After the Test: Drink plenty of water. After receiving IV contrast, you may experience a mild flushed feeling. This is normal. On occasion, you may experience a mild rash up to 24 hours after the test. This is not dangerous. If this occurs, you can take Benadryl 25 mg and increase your fluid intake. If you experience trouble breathing, this can be serious. If it is severe call 911 IMMEDIATELY. If it is mild, please call our office.   We will call to schedule your test 2-4 weeks out understanding that some insurance companies will need an authorization prior to the service being performed.   For more information and frequently asked questions, please visit our website : [email or web address]  For non-scheduling related questions, please contact the cardiac imaging nurse navigator should you have any questions/concerns: Cardiac Imaging Nurse Navigators Direct Office Dial: [phone]   For scheduling needs, including cancellations and rescheduling, please call [place], [phone].    Follow-Up: At [facility], you and your health needs are our priority.  As part of our continuing  mission to provide you with exceptional heart care, we have created designated Provider Care Teams.  These Care Teams include your primary Cardiologist (physician) and Advanced Practice Providers (APPs -  Physician Assistants and Nurse Practitioners) who all work together to provide you with the care you need, when you need it.  We recommend signing  up for the patient portal called "MyChart".  Sign up information is provided on this After Visit Summary.  MyChart is used to connect with patients for Virtual Visits (Telemedicine).  Patients are able to view lab/test results, encounter notes, upcoming appointments, etc.  Non-urgent messages can be sent to your provider as well.   To learn more about what you can do with MyChart, go to [email or web address].    Your next appointment:   Follow up after CT   Provider:   Dr.Jordan

## 2023-04-16 ENCOUNTER — Encounter: Payer: Self-pay | Admitting: Cardiology

## 2023-04-16 LAB — BASIC METABOLIC PANEL
BUN/Creatinine Ratio: 15 (ref 10–24)
BUN: 12 mg/dL (ref 8–27)
CO2: 25 mmol/L (ref 20–29)
Calcium: 10.1 mg/dL (ref 8.6–10.2)
Chloride: 100 mmol/L (ref 96–106)
Creatinine, Ser: 0.79 mg/dL (ref 0.76–1.27)
Glucose: 98 mg/dL (ref 70–99)
Potassium: 5.2 mmol/L (ref 3.5–5.2)
Sodium: 141 mmol/L (ref 134–144)
eGFR: 96 mL/min/{1.73_m2} (ref >59)

## 2023-04-16 NOTE — Telephone Encounter (Signed)
Please advise if patient is taking Anthony [name] or Atorvastatin.20 [name]. Last note stated  Will increase lipitor to 20 [name] daily. Follow up labs with Dr Clelia Croft in 3 months   Patient stated he has been taking Anthony [name]

## 2023-04-17 ENCOUNTER — Other Ambulatory Visit: Payer: Self-pay

## 2023-04-17 DIAGNOSIS — E7800 Pure hypercholesterolemia, unspecified: Secondary | ICD-10-CM

## 2023-04-17 DIAGNOSIS — I2510 Atherosclerotic heart disease of native coronary artery without angina pectoris: Secondary | ICD-10-CM

## 2023-04-17 MED ORDER — ATORVASTATIN CALCIUM 40 MG PO TABS: 40.0000 mg | ORAL_TABLET | Freq: Every day | ORAL | 3 refills | Status: DC

## 2023-04-17 NOTE — Telephone Encounter (Signed)
Spoke to patient Dr.Jordan advised to stop Pravastatin and start Atorvastatin 40 mg daily.Repeat fasting lipid and hepatic panels in 3 months.Lab orders mailed.

## 2023-04-30 ENCOUNTER — Telehealth (HOSPITAL_COMMUNITY): Payer: Self-pay | Admitting: *Deleted

## 2023-04-30 NOTE — Telephone Encounter (Signed)
Reaching out to patient to offer assistance regarding upcoming cardiac imaging study; pt verbalizes understanding of appt date/time, parking situation and where to check in, pre-test NPO status and medications ordered, and verified current allergies; name and call back number provided for further questions should they arise  Larey Brick RN Navigator Cardiac Imaging [facility] [phone] office [phone] cell  Patient to take 50mg  metoprolol tartrate two hours prior to his cardiac CT scan if his HR is greater than 65 bpm. He is aware to arrive at 8:30 AM.

## 2023-05-01 ENCOUNTER — Ambulatory Visit (HOSPITAL_COMMUNITY)
Admission: RE | Admit: 2023-05-01 | Discharge: 2023-05-01 | Disposition: A | Discharge status: Home / Self Care | Payer: Medicare Other | Source: Ambulatory Visit | Attending: Cardiology | Admitting: Cardiology

## 2023-05-01 DIAGNOSIS — R072 Precordial pain: Secondary | ICD-10-CM

## 2023-05-01 DIAGNOSIS — I2510 Atherosclerotic heart disease of native coronary artery without angina pectoris: Secondary | ICD-10-CM | POA: Diagnosis not present

## 2023-05-01 DIAGNOSIS — R079 Chest pain, unspecified: Secondary | ICD-10-CM | POA: Insufficient documentation

## 2023-05-01 MED ORDER — NITROGLYCERIN 0.4 MG SL SUBL
0.8000 mg | SUBLINGUAL_TABLET | Freq: Once | SUBLINGUAL | Status: AC
  Administered 2023-05-01: 0.8 mg via SUBLINGUAL

## 2023-05-01 MED ORDER — NITROGLYCERIN 0.4 MG SL SUBL
SUBLINGUAL_TABLET | SUBLINGUAL | Status: AC
  Filled 2023-05-01: qty 2

## 2023-05-01 MED ORDER — IOHEXOL 350 MG/ML SOLN
100.0000 mL | Freq: Once | INTRAVENOUS | Status: AC | PRN
  Administered 2023-05-01: 100 mL via INTRAVENOUS

## 2023-07-15 LAB — HEPATIC FUNCTION PANEL
ALT: 22 [IU]/L (ref 0–44)
AST: 17 [IU]/L (ref 0–40)
Albumin: 4.3 g/dL (ref 3.9–4.9)
Alkaline Phosphatase: 105 [IU]/L (ref 44–121)
Bilirubin Total: 0.5 mg/dL (ref 0.0–1.2)
Bilirubin, Direct: 0.18 mg/dL (ref 0.00–0.40)
Total Protein: 6.8 g/dL (ref 6.0–8.5)

## 2023-07-15 LAB — LIPID PANEL
Chol/HDL Ratio: 3.6 {ratio} (ref 0.0–5.0)
Cholesterol, Total: 135 mg/dL (ref 100–199)
HDL: 38 mg/dL — ABNORMAL LOW (ref >39)
LDL Chol Calc (NIH): 77 mg/dL (ref 0–99)
Triglycerides: 105 mg/dL (ref 0–149)
VLDL Cholesterol Cal: 20 mg/dL (ref 5–40)

## 2023-08-01 ENCOUNTER — Other Ambulatory Visit: Payer: Self-pay | Admitting: Family Medicine

## 2023-08-01 DIAGNOSIS — E041 Nontoxic single thyroid nodule: Secondary | ICD-10-CM

## 2023-08-07 ENCOUNTER — Other Ambulatory Visit (HOSPITAL_COMMUNITY)
Admission: RE | Admit: 2023-08-07 | Discharge: 2023-08-07 | Disposition: A | Discharge status: Home / Self Care | Payer: Medicare Other | Source: Ambulatory Visit | Attending: Interventional Radiology | Admitting: Interventional Radiology

## 2023-08-07 ENCOUNTER — Ambulatory Visit
Admission: RE | Admit: 2023-08-07 | Discharge: 2023-08-07 | Disposition: A | Discharge status: Home / Self Care | Payer: Medicare Other | Source: Ambulatory Visit | Attending: Family Medicine | Admitting: Family Medicine

## 2023-08-07 DIAGNOSIS — E041 Nontoxic single thyroid nodule: Secondary | ICD-10-CM | POA: Insufficient documentation

## 2023-08-08 LAB — CYTOLOGY - NON PAP

## 2023-08-19 LAB — LAB REPORT - SCANNED
A1c: 6.1
EGFR: 96

## 2024-02-16 LAB — LAB REPORT - SCANNED
A1c: 5.8
Albumin, Urine POC: <0.7
Creatinine, POC: 71 mg/dL
EGFR: 97
Microalb Creat Ratio: <9.8
TSH: 0.6 (ref 0.41–5.90)

## 2024-03-26 ENCOUNTER — Other Ambulatory Visit: Payer: Self-pay | Admitting: Cardiology

## 2024-04-14 NOTE — Progress Notes (Unsigned)
 Cardiology Office Note:    Date:  04/16/2024   ID:  Anthony [name], DOB 1950/10/16, MRN [id number]  PCP:  Loreli Kins, MD   [facility] Providers Cardiologist:  None     Referring MD: Loreli Kins, MD   No chief complaint on file.   History of Present Illness:    Anthony [name] is a 73 y.o. male seen at the request of Dr Loreli for evaluation of new incomplete RBBB and CAD. He had CT chest for cancer screening in January showing coronary calcification in the LAD and aortic atherosclerosis. He does report that he gets SOB with exertion and over the past several months has noted right anterior chest pain with exertion such as playing golf. No history of DM, HTN or family history of CAD.   He had a coronary CTA last year showing nonobstructive CAD. Risk factor modification recommended.    He was on pravastatin. This was switched to lipitor 40 mg and resulted in drop in LDL from 116 to 75. He is still smoking 1 pk/week. He is active walking daily, playing golf, goes to gym 3 days a week. Is trying to eat healthier. No chest pain, dizziness, palpitations.   Past Medical History:  Diagnosis Date   Allergy    Arthritis    Hyperlipidemia    Rotator cuff disorder 06/10/1998   surgery     Past Surgical History:  Procedure Laterality Date   COLON SURGERY     copd     HERNIA REPAIR     SMALL INTESTINE SURGERY     VASECTOMY      Current Medications: Current Meds  Medication Sig   aspirin EC 81 MG tablet Take 81 mg by mouth daily. Swallow whole.   atorvastatin  (LIPITOR) 40 MG tablet TAKE 1 TABLET BY MOUTH EVERY DAY   co-enzyme Q-10 30 MG capsule Take 100 mg by mouth daily.   cyanocobalamin (VITAMIN B12) 500 MCG tablet Take 500 mcg by mouth daily.   loratadine (CLARITIN) 10 MG tablet Take 10 mg by mouth daily.   magnesium oxide (MAG-OX) 400 (240 Mg) MG tablet Take 400 mg by mouth daily.   Multiple Vitamins-Minerals (MENS MULTIVITAMIN PLUS PO) Take by mouth.    omeprazole (PRILOSEC) 10 MG capsule Take 10 mg by mouth daily.   Plant Sterols and Stanols (CHOLESTOFF) 450 MG TABS Take 450 mg by mouth 3 (three) times a week.   psyllium (REGULOID) 0.52 g capsule Take 0.52 g by mouth daily.     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    Social Drivers of Corporate Investment Banker Strain: Not on file  Food Insecurity: Not on file  Transportation Needs: Not on file  Physical Activity: Not on file  Stress: Not on file  Social Connections: Unknown (02/17/2023)   Received from [facility]   Social  Network    Social Network: Not on file     Family History: The patient's family history includes Breast cancer in his mother; Cancer in his mother.  ROS:   Please see the history of present illness.     All other systems reviewed and are negative.  EKGs/Labs/Other Studies Reviewed:    The following studies were reviewed today: EKG Interpretation Date/Time:  Friday April 16 2024 08:36:18 EST Ventricular Rate:  67 PR Interval:  176 QRS Duration:  104 QT Interval:  410 QTC Calculation: 433 R Axis:   -14  Text Interpretation: Normal sinus rhythm Incomplete right bundle branch block When compared with ECG of 15-Apr-2023 14:36, No significant change was found Confirmed by Madissen Wyse [phone]) on 04/16/2024 8:40:42 AM  Coronary CTA 05/01/23: Cardiac/Coronary  CTA   TECHNIQUE: The patient was scanned on a [employer].    FINDINGS: A 100 kV prospective scan was triggered in the descending thoracic aorta at 111 HU's. Axial non-contrast 3 mm slices were carried out through the heart. The data set was analyzed on a dedicated work station and scored using the Agatson method. Gantry rotation speed was 250 msecs and collimation was .6 mm. No beta blockade and 0.8 mg of sl NTG was given. The 3D data set was reconstructed in 5% intervals of the 67-82 % of the R-R cycle. Diastolic phases were analyzed on a dedicated work station using MPR, MIP and VRT modes. The patient received 80 cc of contrast.   Image quality: Fair with misregistration artifact.   Aorta: Mild dilatation of the aortic root (40.1 mm). No calcifications. No dissection.   Aortic Valve:  Trileaflet.  No calcifications.   Coronary Arteries:  Normal coronary origin.  Right dominance.   RCA is a large dominant artery that gives rise to PDA and PLA. There is a minimal (<24%) focal calcified plaque in the proximal RCA.   Left main is a large artery that gives rise to LAD and LCX arteries. Minimal (<24%) focal calcified plaque at the ostium of the left main artery.   LAD is a large vessel. Minimal focal calcified plaques in the proximal LAD. The mid LAD with minimal mixed plaque. Mid to distal LAD with a minimal focal soft plaque.   LCX is a non-dominant artery that gives rise to one large OM1 branch. There is no plaque.   Coronary Calcium  Score:   Left main: 130   Left anterior descending artery: 155   Left circumflex artery: 0   Right coronary artery: 2.44   Total: 287   Percentile: 63   Other findings:   Normal pulmonary vein drainage into the left atrium.   Normal left atrial appendage without a thrombus.   Normal size of the pulmonary artery.   IMPRESSION: 1. Coronary calcium  score of 287. This was 50 percentile for age and sex matched control.   2. Normal coronary origin with right dominance.   3. CAD-RADS 1.  Minimal non-obstructive CAD (0-24%). Consider non-atherosclerotic causes of chest pain. Consider preventive therapy and risk factor modification.   4. Total plaque volume 292 mm3 which is 30th percentile for age- and sex-matched controls (calcified plaque 61 mm3; non-calcified plaque 231 mm3; Low attenuation 10 mm3). EKG Interpretation Date/Time:  Friday April 16 2024 08:36:18 EST Ventricular Rate:  67 PR Interval:  176 QRS Duration:  104 QT Interval:  410 QTC Calculation: 433 R Axis:   -14  Text Interpretation: Normal sinus rhythm Incomplete right bundle branch block When  compared with ECG of 15-Apr-2023 14:36, No significant change was found Confirmed by Yoltzin Ransom [id number]) on 04/16/2024 8:40:42 AM    Recent Labs: 07/14/2023: ALT 22 02/16/2024: TSH 0.60  Recent Lipid Panel    Component Value Date/Time   CHOL 135 07/14/2023 0926   TRIG 105 07/14/2023 0926   HDL 38 (L) 07/14/2023 0926   CHOLHDL 3.6 07/14/2023 0926   LDLCALC 77 07/14/2023 0926   Dated 02/14/23: cholesterol 182, triglycerides 169, HDL 36, LDL 116. A1c 5.7%. CMET normal  EKG Interpretation Date/Time:  Friday April 16 2024 08:36:18 EST Ventricular Rate:  67 PR Interval:  176 QRS Duration:  104 QT Interval:  410 QTC Calculation: 433 R Axis:   -14  Text Interpretation: Normal sinus rhythm Incomplete right bundle branch block When compared with ECG of 15-Apr-2023 14:36, No significant change was found Confirmed by Maximilliano Kersh [phone]) on 04/16/2024 8:40:42 AM    Risk Assessment/Calculations:                Physical Exam:    VS:  BP 120/76 (BP Location: Right Arm, Patient Position: Sitting, Cuff Size: Normal)   Pulse 73   Resp 16   Ht 6' 1 (1.854 m)   Wt 217 lb 6.4 oz (98.6 kg)   SpO2 96%   BMI 28.68 kg/m     Wt Readings from Last 3 Encounters:  04/16/24 217 lb 6.4 oz (98.6 kg)  04/15/23 214 lb (97.1 kg)  10/07/21 225 lb (102.1 kg)     GEN:  Well nourished, well developed in no acute  distress HEENT: Normal NECK: No JVD; No carotid bruits LYMPHATICS: No lymphadenopathy CARDIAC: RRR, no murmurs, rubs, gallops RESPIRATORY:  Clear to auscultation without rales, wheezing or rhonchi  ABDOMEN: Soft, non-tender, non-distended MUSCULOSKELETAL:  No edema; No deformity  SKIN: Warm and dry NEUROLOGIC:  Alert and oriented x 3 PSYCHIATRIC:  Normal affect   ASSESSMENT:    1. Coronary artery calcification   2. High cholesterol   3. Tobacco abuse     PLAN:    In order of problems listed above:  CAD. CTA last year showed plaque but no obstruction. He is asymptomatic. Continue risk factor modification. Tobacco abuse. Encourage complete cessation HLD. With coronary calcification recommend target LDL < 70. Significant improvement on lipitor. Now LDL 75. Continue lifestyle modification and follow for now. If LDL still not at goal consider adding Zetia Incomplete RBBB. No conduction delay. This is benign and a normal variant.        I will follow up in one year   Medication Adjustments/Labs and Tests Ordered: Current medicines are reviewed at length with the patient today.  Concerns regarding medicines are outlined above.  Orders Placed This Encounter  Procedures   EKG 12-Lead   No orders of the defined types were placed in this encounter.   Patient Instructions  Medication Instructions:   *If you need a refill on your cardiac medications before your next appointment, please call your pharmacy*  Lab Work:   Testing/Procedures:   Follow-Up: At [facility], you and your health needs are our priority.  As part of our continuing mission to provide you with exceptional heart care, our providers are all part of one team.  This team includes your primary Cardiologist (physician) and Advanced Practice Providers or APPs (Physician Assistants and Nurse Practitioners) who all work together to provide you with the care you need, when you need it.  Your next  appointment:  Provider:     We recommend signing up for the patient portal called MyChart.  Sign up information is provided on this After Visit Summary.  MyChart is used to connect with patients for Virtual Visits (Telemedicine).  Patients are able to view lab/test results, encounter notes, upcoming appointments, etc.  Non-urgent messages can be sent to your provider as well.   To learn more about what you can do with MyChart, go to [email or web address].         Signed, Gaile Allmon, MD  04/16/2024 8:50 AM    [facility]

## 2024-04-16 ENCOUNTER — Ambulatory Visit: Attending: Cardiology | Admitting: Cardiology

## 2024-04-16 ENCOUNTER — Encounter: Payer: Self-pay | Admitting: Cardiology

## 2024-04-16 VITALS — BP 120/76 | HR 73 | Resp 16 | Ht 73.0 in | Wt 217.4 lb

## 2024-04-16 DIAGNOSIS — Z720 Tobacco use: Secondary | ICD-10-CM | POA: Diagnosis not present

## 2024-04-16 DIAGNOSIS — E7800 Pure hypercholesterolemia, unspecified: Secondary | ICD-10-CM

## 2024-04-16 DIAGNOSIS — I2510 Atherosclerotic heart disease of native coronary artery without angina pectoris: Secondary | ICD-10-CM

## 2024-04-16 NOTE — Patient Instructions (Addendum)
 Medication Instructions:  Continue same medications *If you need a refill on your cardiac medications before your next appointment, please call your pharmacy*  Lab Work: None ordered  Testing/Procedures: None ordered  Follow-Up: At [facility], you and your health needs are our priority.  As part of our continuing mission to provide you with exceptional heart care, our providers are all part of one team.  This team includes your primary Cardiologist (physician) and Advanced Practice Providers or APPs (Physician Assistants and Nurse Practitioners) who all work together to provide you with the care you need, when you need it.  Your next appointment:  1 year    Call in July to schedule Nov appointment     Provider:  Dr.Jordan   We recommend signing up for the patient portal called MyChart.  Sign up information is provided on this After Visit Summary.  MyChart is used to connect with patients for Virtual Visits (Telemedicine).  Patients are able to view lab/test results, encounter notes, upcoming appointments, etc.  Non-urgent messages can be sent to your provider as well.   To learn more about what you can do with MyChart, go to [email or web address].

## 2024-06-30 ENCOUNTER — Other Ambulatory Visit: Payer: Self-pay | Admitting: Cardiology
